# Patient Record
Sex: Female | Born: 1935 | Race: White | Hispanic: No | Marital: Married | Smoking: Never smoker
Health system: Southern US, Community
[De-identification: ages and names within clinical notes are randomized; demographics above are authoritative.]

## PROBLEM LIST (undated history)

## (undated) DIAGNOSIS — R5382 Chronic fatigue, unspecified: Secondary | ICD-10-CM

## (undated) DIAGNOSIS — I10 Essential (primary) hypertension: Secondary | ICD-10-CM

## (undated) DIAGNOSIS — R0902 Hypoxemia: Secondary | ICD-10-CM

## (undated) DIAGNOSIS — M949 Disorder of cartilage, unspecified: Secondary | ICD-10-CM

## (undated) DIAGNOSIS — J449 Chronic obstructive pulmonary disease, unspecified: Secondary | ICD-10-CM

## (undated) DIAGNOSIS — M899 Disorder of bone, unspecified: Secondary | ICD-10-CM

## (undated) DIAGNOSIS — T7840XA Allergy, unspecified, initial encounter: Secondary | ICD-10-CM

## (undated) DIAGNOSIS — J984 Other disorders of lung: Secondary | ICD-10-CM

## (undated) DIAGNOSIS — A799 Rickettsiosis, unspecified: Secondary | ICD-10-CM

## (undated) DIAGNOSIS — I272 Pulmonary hypertension, unspecified: Secondary | ICD-10-CM

## (undated) DIAGNOSIS — Z9981 Dependence on supplemental oxygen: Secondary | ICD-10-CM

## (undated) HISTORY — DX: Other disorders of lung: J98.4

## (undated) HISTORY — DX: Dependence on supplemental oxygen: Z99.81

## (undated) HISTORY — DX: Chronic obstructive pulmonary disease, unspecified: J44.9

## (undated) HISTORY — DX: Rickettsiosis, unspecified: A79.9

## (undated) HISTORY — DX: Essential (primary) hypertension: I10

## (undated) HISTORY — DX: Pulmonary hypertension, unspecified: I27.20

## (undated) HISTORY — PX: OOPHORECTOMY: SHX86

## (undated) HISTORY — DX: Chronic fatigue, unspecified: R53.82

## (undated) HISTORY — DX: Disorder of cartilage, unspecified: M94.9

## (undated) HISTORY — DX: Allergy, unspecified, initial encounter: T78.40XA

## (undated) HISTORY — DX: Hypoxemia: R09.02

## (undated) HISTORY — DX: Disorder of bone, unspecified: M89.9

---

## 2009-09-04 ENCOUNTER — Encounter: Payer: Self-pay | Admitting: Internal Medicine

## 2009-09-07 ENCOUNTER — Encounter: Payer: Self-pay | Admitting: Internal Medicine

## 2009-09-10 DIAGNOSIS — I272 Pulmonary hypertension, unspecified: Secondary | ICD-10-CM

## 2009-09-10 DIAGNOSIS — M899 Disorder of bone, unspecified: Secondary | ICD-10-CM

## 2009-09-10 DIAGNOSIS — M949 Disorder of cartilage, unspecified: Secondary | ICD-10-CM

## 2009-09-11 ENCOUNTER — Ambulatory Visit: Payer: Self-pay | Admitting: Internal Medicine

## 2009-09-11 DIAGNOSIS — R0602 Shortness of breath: Secondary | ICD-10-CM | POA: Insufficient documentation

## 2009-09-21 ENCOUNTER — Ambulatory Visit: Payer: Self-pay | Admitting: Internal Medicine

## 2009-10-02 ENCOUNTER — Telehealth (INDEPENDENT_AMBULATORY_CARE_PROVIDER_SITE_OTHER): Payer: Self-pay | Admitting: *Deleted

## 2009-11-02 ENCOUNTER — Ambulatory Visit: Payer: Self-pay | Admitting: Internal Medicine

## 2009-11-02 ENCOUNTER — Ambulatory Visit: Payer: Self-pay | Admitting: Cardiology

## 2009-11-02 LAB — CONVERTED CEMR LAB: CRP, High Sensitivity: 7.47 — ABNORMAL HIGH (ref 0.00–5.00)

## 2009-11-14 ENCOUNTER — Telehealth (INDEPENDENT_AMBULATORY_CARE_PROVIDER_SITE_OTHER): Payer: Self-pay | Admitting: *Deleted

## 2009-11-16 ENCOUNTER — Telehealth: Payer: Self-pay | Admitting: Internal Medicine

## 2009-11-29 ENCOUNTER — Encounter: Payer: Self-pay | Admitting: Internal Medicine

## 2009-12-07 ENCOUNTER — Encounter: Payer: Self-pay | Admitting: Internal Medicine

## 2010-01-03 ENCOUNTER — Encounter (HOSPITAL_COMMUNITY): Admission: RE | Admit: 2010-01-03 | Discharge: 2010-03-08 | Payer: Self-pay | Admitting: Internal Medicine

## 2010-01-07 ENCOUNTER — Encounter: Payer: Self-pay | Admitting: Internal Medicine

## 2010-02-10 ENCOUNTER — Encounter: Payer: Self-pay | Admitting: Internal Medicine

## 2010-02-14 ENCOUNTER — Telehealth (INDEPENDENT_AMBULATORY_CARE_PROVIDER_SITE_OTHER): Payer: Self-pay | Admitting: *Deleted

## 2010-03-04 ENCOUNTER — Ambulatory Visit: Payer: Self-pay | Admitting: Internal Medicine

## 2010-03-09 ENCOUNTER — Encounter (HOSPITAL_COMMUNITY)
Admission: RE | Admit: 2010-03-09 | Discharge: 2010-06-07 | Payer: Self-pay | Source: Home / Self Care | Attending: Internal Medicine | Admitting: Internal Medicine

## 2010-03-14 ENCOUNTER — Telehealth: Payer: Self-pay | Admitting: Internal Medicine

## 2010-03-18 ENCOUNTER — Encounter: Payer: Self-pay | Admitting: Internal Medicine

## 2010-04-16 ENCOUNTER — Encounter: Payer: Self-pay | Admitting: Internal Medicine

## 2010-05-13 ENCOUNTER — Telehealth: Payer: Self-pay | Admitting: Internal Medicine

## 2010-06-09 ENCOUNTER — Encounter (HOSPITAL_COMMUNITY)
Admission: RE | Admit: 2010-06-09 | Discharge: 2010-07-09 | Payer: Self-pay | Source: Home / Self Care | Attending: Internal Medicine | Admitting: Internal Medicine

## 2010-06-13 ENCOUNTER — Encounter: Payer: Self-pay | Admitting: Internal Medicine

## 2010-06-18 ENCOUNTER — Telehealth: Payer: Self-pay | Admitting: Internal Medicine

## 2010-06-28 ENCOUNTER — Telehealth (INDEPENDENT_AMBULATORY_CARE_PROVIDER_SITE_OTHER): Payer: Self-pay | Admitting: *Deleted

## 2010-06-28 DIAGNOSIS — M4010 Other secondary kyphosis, site unspecified: Secondary | ICD-10-CM | POA: Insufficient documentation

## 2010-07-09 NOTE — Letter (Signed)
Summary: [facility]  [facility]   Imported By: Lennie Odor 09/21/2009 17:04:50  _____________________________________________________________________  External Attachment:    Type:   Image     Comment:   External Document

## 2010-07-09 NOTE — Miscellaneous (Signed)
Summary: Orders Update pft charges  Clinical Lists Changes  Orders: Added new Service order of Carbon Monoxide diffusing w/capacity (94720) - Signed Added new Service order of Lung Volumes (94240) - Signed Added new Service order of Spirometry (Pre & Post) (94060) - Signed 

## 2010-07-09 NOTE — Progress Notes (Signed)
Summary: oxygen questions  Phone Note Call from Patient Call back at Home Phone [phone]   Caller: Patient Call For: MR Summary of Call: Has questions about her oxygen usage. Initial call taken by: Darletta Moll,  November 16, 2009 10:00 AM  Follow-up for Phone Call        pt just wanted to clarity when she was too use her oxygen. i advised that at last OV she walked 2 laps when she desaturated so that equals 370 feet, so if she is going to walk more then that she needs to wear oxygen, ie if she is going shopping, but not if she is going from her living room to her bathroom. Pt states understanding. Carron Curie CMA  November 16, 2009 10:14 AM

## 2010-07-09 NOTE — Progress Notes (Signed)
Summary: oxygen  Phone Note Call from Patient   Caller: Patient Call For: ramaswamy Summary of Call: pt received a call today about getting oxygen . she is not aware that Dr Marchelle Gearing was ordering it for her Initial call taken by: Rickard Patience,  November 14, 2009 9:43 AM  Follow-up for Phone Call        Spoke with pt about the Oxygen order per MR; she is aware of recs from MR about using O2 for activity usage.Also, made aware of test results and other recs from MR. 4 month reminder placed in IDX for pt to follow up with MR.[facility] CMA  November 14, 2009 9:50 AM

## 2010-07-09 NOTE — Letter (Signed)
Summary: [facility]  [facility]   Imported By: Lester Nina 12/12/2009 11:38:59  _____________________________________________________________________  External Attachment:    Type:   Image     Comment:   External Document

## 2010-07-09 NOTE — Letter (Signed)
Summary: [facility]  [facility]   Imported By: Lester Hanover 12/05/2009 09:28:01  _____________________________________________________________________  External Attachment:    Type:   Image     Comment:   External Document

## 2010-07-09 NOTE — Letter (Signed)
Summary: Physician's Statement & letter for oxygen use/[employer]  Physician's Statement & letter for oxygen use/[employer]   Imported By: Sherian Rein 03/21/2010 14:31:20  _____________________________________________________________________  External Attachment:    Type:   Image     Comment:   External Document

## 2010-07-09 NOTE — Progress Notes (Signed)
Summary: portable o2 -   Phone Note Call from Patient Call back at Home Phone [phone]   Caller: Patient Call For: Julia Lucero Reason for Call: Talk to Nurse Summary of Call: wants to know if she can get a smaller more portable unit for her oxygen?  Need to know about traveling with her oxygen also. PLanning a trip in the fall. Initial call taken by: Eugene Gavia,  February 14, 2010 11:23 AM  Follow-up for Phone Call        per chart, pt uses 2L o2 with activity.  called spoke with patient who is traveling by air to [place] later this fall x3weeks and states that her portable o2 tank is not very small and easily managed.  also, pt was told by friend that [facility] can supply her o2 while in [place].  would like smaller portable tank, and order to switch to [facility] if they can provide her with o2 while on vacation.  please advise if these orders may be placed, thanks!    *note: pt will check with her airline company to find out what documentation is needed for her to have o2 onboard.   Follow-up by: Boone Master CNA/MA,  February 14, 2010 12:51 PM  Additional Follow-up for Phone Call Additional follow up Details #1::         www.airlineoxygencouncil.org  She should check that website for resources. In general, each airline has different specs -so she should find the right fare and then call that airline and find out what combo of fare and o2 rules works best for her.   SHe could also buy  a pocket pulse ox on [employer] or [facility] etc. and make sure it is > 92% while flying.   She will need o2 in fllight Order sent for her requests   Additional Follow-up by: Kalman Shan MD,  February 14, 2010 5:04 PM    Additional Follow-up for Phone Call Additional follow up Details #2::    LMOM TCB. Boone Master CNA/MA  February 14, 2010 5:14 PM   Spoke with pt and notified of the above recs per MR.  Pt verbalized understanding and is aware that order was sent to [facility] for smaller o2  system. Follow-up by: Vernie Murders,  February 15, 2010 9:19 AM

## 2010-07-09 NOTE — Letter (Signed)
Summary: [facility]N for Oxygen / [facility]  [facility] for Oxygen / [facility]   Imported By: Lennie Odor 04/23/2010 11:22:59  _____________________________________________________________________  External Attachment:    Type:   Image     Comment:   External Document

## 2010-07-09 NOTE — Assessment & Plan Note (Signed)
Summary: PULM HYPERTENSION///kp   Visit Type:  Initial Consult Copy to:  Dr. Allyson Sabal Primary Provider/Referring Provider:  Dr. [facility]  CC:  Pulmonary Consult for increased SOB. Pt states she had a sinus infection an dthat is when she noticed SOB. This has been x 2 weeks. Marland Kitchen  History of Present Illness: 75  year female who had Rickets as a child. AT baseline has some fatigue and feelings of having to concentrate on walking since 2008/2009 but still managing 2h aerobics at home. Otherwise well. [place]  through 09/03/2009 for vacation.. Was well prior to trip and during first part of trip to [place].  During  2nd half of trip felt some sinus congestion. Took some OTC chlortrimeton antihistamine but it did not clear up symptoms. During this time started noticing insidious onset dyspnea on exertion esp at airport on way back. During change [place] in [place] airport, dyspnea was significant enough that she needed wheelchair. Subsequently went to toilet in plane and was 'feeling awful'. Husband through she looked awful, and per husband she passed out. Got basic first aid in plane (Orange Juice and  O2) and felt better. At landing she declined EMS. Saw Dr. Jacky Kindle next day on 09/04/2009. Noted to be hypoxemic 80% RA. -> CT PA gram showed cardiomegagly, bilateral effusion and RLL consolidation (per my review). She saw  Dr Allyson Sabal and was  started on  low dose diuresis samde day. ECHO 09/04/2009 showed LVEF >55% with impaired relaxation but also flattened septum reflectiv of RVSP of .  Followed with Dr. Allyson Sabal on 09/07/2009 and was feeling better. Asked to continue diuresis and started on amlodipine. Currently feeling better and walking at normal pace. Sinus also feels better. WAlkied 185 feet x 3 laps in office and did not desaturate.   Preventive Screening-Counseling & Management  Alcohol-Tobacco     Smoking Status: never  Current Medications (verified): 1)   Calcium Citrate 200 Mg Tabs (Calcium Citrate) .Marland Kitchen.. 1-2 Tablets Once Daily 2)  Multivitamins  Tabs (Multiple Vitamin) .Marland Kitchen.. 1 Once Daily 3)  Amlodipine Besylate 5 Mg Tabs (Amlodipine Besylate) .Marland Kitchen.. 1 Once Daily 4)  Furosemide 20 Mg Tabs (Furosemide) .Marland Kitchen.. 1 Once Daily 5)  Complete Allergy Relief 25 Mg Tabs (Diphenhydramine Hcl) .... As Needed Fo Rseasonal Allergies  Allergies (verified): 1)  ! Pcn  Past History:  Family History: Last updated: 09-20-09 Father-diabetes, died age 43 of MI 67, rheumatoid arthritis brother-colon cancer-died age 47  Social History: Last updated: 09/20/2009 Homemaker Patient never smoked.  No passive smoking 2 kids - [place], [place].  Husband is ex-[employer]  Risk Factors: Smoking Status: never (09-20-09)  Past Medical History: #OSTEOPENIA (ICD-733.90) #Seasonal Allergies #Chronic Fatigue with walking NOS x since 2008/2009  #RICKETTS as child with Malnutrition > she does not know details, states she had it as infant and had pneumonia as child before age 78 > Born in [place]  #Denies DM, CAD, STroke, TB, Hyperlipidemia, DVT, PE, Weight loss drug, RA, Lupus, Collagen Vasc Dz  Past Surgical History: 1965-ooph  Family History: Father-diabetes, died age 82 of MI 76, rheumatoid arthritis brother-colon cancer-died age 46  Social History: 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  No passive smoking 2 kids - [place], [place].  Husband is ex-[employer] aer[employer]Smoking Status:  never  Review of Systems       The patient complains of shortness of breath with activity, non-productive cough, and nasal congestion/difficulty breathing through nose.  The patient  denies shortness of breath at rest, productive cough, coughing up blood, chest pain, irregular heartbeats, acid heartburn, indigestion, loss of appetite, weight change, abdominal pain, difficulty swallowing, sore throat, tooth/dental  problems, headaches, sneezing, itching, ear ache, anxiety, depression, hand/feet swelling, joint stiffness or pain, rash, change in color of mucus, and fever.    Vital Signs:  Patient profile:   75 year old female Height:      59 inches Weight:      139.13 pounds BMI:     28.20 O2 Sat:      96 % on Room air Temp:     97.7 degrees F oral Pulse rate:   72 / minute BP sitting:   120 / 76  (right arm) Cuff size:   regular  Vitals Entered By: Carron Curie CMA (September 11, 2009 10:47 AM)  O2 Flow:  Room air  Serial Vital Signs/Assessments:  Comments: 1:22 PM Ambulatory Pulse Oximetry  Resting; HR__80___    02 Sat__94___  Lap1 (185 feet)   HR_91____   02 Sat__98___ Lap2 (185 feet)   HR__95___   02 Sat__91___    Lap3 (185 feet)   HR__95__   02 Sat___90__  _x__Test Completed without Difficulty ___Test Stopped due to:  By: Michel Bickers CMA   CC: Pulmonary Consult for increased SOB. Pt states she had a sinus infection an dthat is when she noticed SOB. This has been x 2 weeks.  Comments Medications reviewed with patient Carron Curie CMA  September 11, 2009 10:59 AM Daytime phone number verified with patient.    Physical Exam  General:  short overweight abnormal bony structure - c/w prior rickets Head:  normocephalic and atraumatic Eyes:  PERRLA/EOM intact; conjunctiva and sclera clear Ears:  TMs intact and clear with normal canals Nose:  no deformity, discharge, inflammation, or lesions Mouth:  no deformity or lesions Neck:  no masses, thyromegaly, or abnormal cervical nodes Chest Wall:  abnormal chest cage Lungs:  clear bilaterally to auscultation and percussion Heart:  regular rate and rhythm, S1, S2 without murmurs, rubs, gallops, or clicks  ? LOUD P2 Abdomen:  bowel sounds positive; abdomen soft and non-tender without masses, or organomegaly Msk:  no deformity or scoliosis noted with normal posture Pulses:  pulses normal Extremities:  no clubbing, cyanosis,  edema, or deformity noted Neurologic:  CN II-XII grossly intact with normal reflexes, coordination, muscle strength and tone Skin:  intact without lesions or rashes Cervical Nodes:  no significant adenopathy Axillary Nodes:  no significant adenopathy Psych:  alert and cooperative; normal mood and affect; normal attention span and concentration   EKG  Procedure date:  09/04/2009  Findings:      independently reviewd trace- looks normal   MISC. Report  Procedure date:  09/04/2009  Findings:      creat 0.8mg % hgb 11.9gm%/MCV 99.5 BNP 148  CT of Chest  Procedure date:  09/04/2009  Findings:      No PE Massive cardiomegaly, interstitial edema, bialteral effusions  Per radilogist c/w CHF  Comments:      no image, only report  CT of Chest  Procedure date:  09/04/2009  Findings:      independnetly reivewed: Bialteral effusion, cardiomegaly and RLL consolidations  Impression & Recommendations:  Problem # 1:  SHORTNESS OF BREATH (SOB) (ICD-786.05) Assessment New  I suspect she has chronic chest cage restriction for many years from RICKETS. This might have caused chronic pulmonary hypertension (Cor pulmonale) thtat might explain some symptoms like chronic inability  to take a deep breath and feeling of chest tighness. I think most recently in [place] she decompensated with acute cor pulmonale (CT findings and ECHO Findings of 3/29) due to RT sided pneumonia. This is a likely scenario. I  hae discussed with Dr. Allyson Sabal who is going to get a nuclear medicine stress test. I  hsupport diuresis and low level calcium channel blockade which have helped. I think we will get full PFT to establish restriction. Then, we will re-scan her in 4-6 weeks or get CXR. IF pna and effusions are resolved, we do Right Heart Cath.   Pulmonary Veno Occlusive Disease is a rare possibility but I doubt she has it  I have explained the above to her over phone on 09/17/2009   Orders: Consultation  Level V [phone])  Medications Added to Medication List This Visit: 1)  Complete Allergy Relief 25 Mg Tabs (Diphenhydramine hcl) .... As needed fo rseasonal allergies  Other Orders: Pulmonary Referral (Pulmonary)  Patient Instructions: 1)  please have full PFT  2)  please bring CD Rom of your CT scan from 3/29 3)  I will talk to Dr. Allyson Sabal 4)  Once you are done with PFT call us for review over phone 5)  Will keep you posted this week with plan (over phone) 6)  ........ 7)  UPdate 09/17/2009 8)  revoiew PFT and decide next step   Immunization History:  Pneumovax Immunization History:    Pneumovax:  pneumovax (06/10/2007)

## 2010-07-09 NOTE — Letter (Signed)
Summary: [facility]   Imported By: Lennie Odor 09/21/2009 17:03:57  _____________________________________________________________________  External Attachment:    Type:   Image     Comment:   External Document

## 2010-07-09 NOTE — Assessment & Plan Note (Signed)
Summary: 4wk rov with chest ct@[facility] @1 :00pm   Visit Type:  Follow-up Copy to:  Dr. Allyson Sabal Primary Provider/Referring Provider:  Dr. Jacky Kindle, [facility]  CC:  Pt here to discuss ct results an dpft results. .  History of Present Illness: Followup Pulmonary hypertension possibly related to thoracic cage defect from childhood Rickets. Decompensated Cor Pulmonale following trip to [facility] in March 2011 due to pneumonia (CT chest  march 2011 at [facility] showedRLL consolidation and bialteral effusion)  OV 11/02/2009: Last seen04/24/11. She presents with her husband. Since last visit she continues to be on lasix and amlodipine. This was started by Dr. Allyson Sabal in March 2011 following her acute decompensation. OVerall she states she feels at pre-illness baseline. Has mild dyspnea and chest tightness when she climbs stairs. Looking for ways to get reconditioned and feel better.Compliant with medicines. No interim problems. Denies edema, cough, orthopnea, paroxysmal nocturnal dyspnea, wheeze. However, she did desaturate with exertion in the office today.   Preventive Screening-Counseling & Management  Alcohol-Tobacco     Smoking Status: never  Current Medications (verified): 1)  Calcium Citrate 200 Mg Tabs (Calcium Citrate) .Marland Kitchen.. 1-2 Tablets Once Daily 2)  Multivitamins  Tabs (Multiple Vitamin) .Marland Kitchen.. 1 Once Daily 3)  Amlodipine Besylate 5 Mg Tabs (Amlodipine Besylate) .Marland Kitchen.. 1 Once Daily 4)  Furosemide 20 Mg Tabs (Furosemide) .Marland Kitchen.. 1 Once Daily 5)  Complete Allergy Relief 25 Mg Tabs (Diphenhydramine Hcl) .... As Needed Fo Rseasonal Allergies 6)  Cream .... Apply To Skin As Needed  Allergies (verified): 1)  ! Pcn  Past History:  Past Surgical History: Last updated: 09/30/2009 1965-ooph  Family History: Last updated: 09-30-2009 Father-diabetes, died age 77 of MI 50, rheumatoid arthritis brother-colon cancer-died age 42  Social History: Last updated:  09/30/09 Homemaker Patient never smoked.  No passive smoking 2 kids - [place], [place].  Husband is [employer]  Risk Factors: Smoking Status: never (11/02/2009)  Past Medical History: Reviewed history from 2009/09/30 and no changes required. #OSTEOPENIA (ICD-733.90) #Seasonal Allergies #Chronic Fatigue with walking NOS x since 2008/2009  #RICKETTS as child with Malnutrition > she does not know details, states she had it as infant and had pneumonia as child before age 44 > Born in [place]  #Denies DM, CAD, STroke, TB, Hyperlipidemia, DVT, PE, Weight loss drug, RA, Lupus, Collagen Vasc Dz  Past Pulmonary History:  Pulmonary History: Rickets as a child. AT baseline has some fatigue and feelings of having to concentrate on walking since 2008/2009 but still managing 2h aerobics at home. Otherwise well. [place]  through 09/03/2009 for vacation.. Was well prior to trip and during first part of trip to [place].  During  2nd half of trip felt some sinus congestion. Took some OTC chlortrimeton antihistamine but it did not clear up symptoms. During this time started noticing insidious onset dyspnea on exertion esp at airport on way back. [place], dyspnea was significant enough that she needed wheelchair. Subsequently went to toilet in plane and was 'feeling awful'. Husband through she looked awful, and per husband she passed out. Got basic first aid in plane (Orange Juice and  O2) and felt better. At landing she declined EMS. Saw Dr. Jacky Kindle next day on 09/04/2009. Noted to be hypoxemic 80% RA. -> CT PA gram showed cardiomegagly, bilateral effusion and RLL consolidation (per my review). She saw  Dr Allyson Sabal and was  started on  low dose diuresis samde day. ECHO  09/04/2009 showed LVEF >55% with impaired relaxation but also flattened septum reflectiv of RVSP of .  Followed with Dr. Allyson Sabal on 09/07/2009 and was feeling  better. Asked to continue diuresis and started on amlodipine. Currently feeling better and walking at normal pace. Sinus also feels better.   > PFTs 09/21/2009 - REstriction p FVC 1.25L/56%, TLC 2.87L/76%, DLCO 7.7/47%,  > WAlkied 185 feet x 3 laps in officeon 09/11/2009  and dropped to 90% > Waked 185 feet x 2 laps office 11/02/2009 -> Dropped to 85% > CT 11/02/2009 - effusion and consolidation resolved  Family History: Reviewed history from 09/11/2009 and no changes required. Father-diabetes, died age 19 of MI 36, rheumatoid arthritis brother-colon cancer-died age 33  Social History: Reviewed history from 09/11/2009 and no changes required. 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  No passive smoking 2 kids - [place], [place].  Husband is [employer]  Review of Systems  The patient denies shortness of breath with activity, shortness of breath at rest, productive cough, non-productive cough, coughing up blood, chest pain, irregular heartbeats, acid heartburn, indigestion, loss of appetite, weight change, abdominal pain, difficulty swallowing, sore throat, tooth/dental problems, headaches, nasal congestion/difficulty breathing through nose, sneezing, itching, ear ache, anxiety, depression, hand/feet swelling, joint stiffness or pain, rash, change in color of mucus, and fever.    Vital Signs:  Patient profile:   75 year old female Height:      59 inches Weight:      136.31 pounds BMI:     27.63 O2 Sat:      92 % on Room air Temp:     98.1 degrees F oral Pulse rate:   92 / minute BP sitting:   122 / 64  (left arm) Cuff size:   regular  Vitals Entered By: Carron Curie CMA (Nov 02, 2009 2:44 PM)  O2 Flow:  Room air CC: Pt here to discuss ct results an dpft results.  Comments Medications reviewed with patient Carron Curie CMA  Nov 02, 2009 2:44 PM Daytime phone number verified with patient. Ambulatory Pulse Oximetry  Resting; HR_88____    02  Sat_93%RA____  Lap1 (185 feet)   HR_98____   02 Sat__93%RA___ Lap2 (185 feet)   HR__100___   02 Sat__85%RA___    Lap3 (185 feet)   HR_____   02 Sat_____  ___Test Completed without Difficulty __X_Test Stopped due to: Patient desauration to 84%RA I was unable to return her to room he dropped so low. half way threough lap #2 she dropped to 88% and as I proceeded to return her to the room she continued to drop. I received a recovery saturation level of 94%RA with a heart rate of 84 Julia Lucero, CMA  Nov 02, 2009 4:06 PM        Physical Exam  General:  short overweight abnormal bony structure - c/w prior rickets Head:  normocephalic and atraumatic Eyes:  PERRLA/EOM intact; conjunctiva and sclera clear Ears:  TMs intact and clear with normal canals Nose:  no deformity, discharge, inflammation, or lesions Mouth:  no deformity or lesions Neck:  no masses, thyromegaly, or abnormal cervical nodes Chest Wall:  abnormal chest cage Lungs:  clear bilaterally to auscultation and percussion Heart:  regular rate and rhythm, S1, S2 without murmurs, rubs, gallops, or clicks  ? LOUD P2 Abdomen:  bowel sounds positive; abdomen soft and non-tender without masses, or organomegaly Msk:  no deformity or scoliosis noted with normal posture Pulses:  pulses normal Extremities:  no clubbing,  cyanosis, edema, or deformity noted Neurologic:  CN II-XII grossly intact with normal reflexes, coordination, muscle strength and tone Skin:  intact without lesions or rashes Cervical Nodes:  no significant adenopathy Axillary Nodes:  no significant adenopathy Psych:  alert and cooperative; normal mood and affect; normal attention span and concentration   CT of Chest  Procedure date:  11/02/2009  Findings:      shows cleraance of effusion and RLL consolidation  MISC. Report  Procedure date:  09/21/2009  Findings:      PFTs 09/21/2009 - REstriction p FVC 1.25L/56%, TLC 2.87L/76%, DLCO 7.7/47%,    Impression & Recommendations:  Problem # 1:  PULMONARY HYPERTENSION (ICD-416.8) Assessment Improved Clinically she is better although she did desaturate with exertion. She continues on lasix on amlodipine. I still think etiology is restrictive thoracic cage due to rickets. Nevertheless, I will orderautomimune profile. FOr now, I have advicsed her that there is no need for RIght Heart Cath. She tells me that stress myoview is normal (formal report I do not have). I have referred her to rehab.Will start home o2 with exertion. I explained that if atuoimune workupnegative thenpulm htn likely definitely due to thoracic cage restriciton and she wont be candidate for advanced PAH Rx. For now, I will plan on seeing her in 4 months. I can discuss right heart cath at followup  Orders: T- * Misc. Laboratory test [phone]) Rehabilitation Referral (Rehab) Est. Patient Level III (60454) Est. Patient Level III (09811) DME Referral (DME)  Medications Added to Medication List This Visit: 1)  Cream  .... Apply to skin as needed  Other Orders: T-Antinuclear Antib (ANA) [phone]) TLB-Rheumatoid Factor (RA) (13086-VH) TLB-Sedimentation Rate (ESR) (85652-ESR) TLB-CRP-High Sensitivity (C-Reactive Protein) (86140-FCRP)  Patient Instructions: 1)  I will do blood test for autoiimmune disease 2)  WE will walk you now for oxygen levels 3)  I will refer you to pulmonary rehab 4)  I am glad you are better 5)  I will call you with blood test result 6)  REturn to see me in 1 year or sooner if there are problems 7)  .... 8)  update 11/13/2009 - rov 4 months 9)  start home o2 with exertion and at night  Appended Document: 4wk rov with chest ct@[facility] @1 :00pm Jen,  Please call her a) please tell her that blood test workup looknig for other causes (Esp "autoimmune" where body attacks itself) is all negative.  b) this means pulm htn is mostly from her rickets related chest cage c) because she dropped oxygen  walking -  I have thought about it and feel she needsto be on oxygen for activities like going out, groceries etc.,  d) definitely start pulmonary rehab e) instead of one year, I will see her in 4 months instead of 1 year what I told her f) apologize for delay-  I was travelling g) if she still wants to talk to me - I will  Appended Document: 4wk rov with chest ct@[facility] @1 :00pm Pt is aware of recs and O2 needed. 25month reminder in IDX for follow up visit with MR.

## 2010-07-09 NOTE — Progress Notes (Signed)
Summary: papers re: air travel w/ O2  Phone Note Call from Patient Call back at Home Phone [phone]   Caller: Patient Call For: ramaswamy Summary of Call: pt dropped off papers to be signed by MR re: air travel w/ portable O2. call pt when ready for pickup. (papers given to [facility]) Initial call taken by: Tivis Ringer, CNA,  March 14, 2010 12:28 PM  Follow-up for Phone Call        forms have been placed in MR look-at. It is in a large brown envelop. Carron Curie CMA  March 15, 2010 4:18 PM   Additional Follow-up for Phone Call Additional follow up Details #1::        i filled it. The 2nd sheet state it needs to be on my letter head. So, please get that copied onto our letterhead or Trudi Ida might have  a copy pre-exisiting on our letter head. once it is done , get hold of me to sign it Additional Follow-up by: Kalman Shan MD,  March 18, 2010 2:02 PM    Additional Follow-up for Phone Call Additional follow up Details #2::    Paperwork completed and placed at front. Pt aware. Carron Curie CMA  March 19, 2010 8:37 AM

## 2010-07-09 NOTE — Assessment & Plan Note (Signed)
Summary: 4 month/apc   Visit Type:  Follow-up Copy to:  Dr. Allyson Sabal Primary Provider/Referring Provider:  Dr. Jacky Kindle, [facility]  CC:  Pt here for 4 month follow-up. 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 Decompensated Cor Pulmonale and discovery of pulmonary hypertension on ECHO (no right heart cath) following trip to [facility] in March 2011 due to pneumonia (CT chest  march 2011 at [facility] showedRLL consolidation and bialteral effusion). Negative Autoimmune Profile May 2011 except for mild raised CRP and borderline positive rheumatoid factor     March 04, 2010: feels well. Doing rehab. Not desaturating there. Due to fly to [facility] 10/20/2011on [employer]. Lot of questions on  flight o2. WE walked her today 185 feet x 3 laps and lowest o2 was only 92%. This is a huge improvement because in MAy 2011 she did desaturate with exertion. No new complaints. She continues on lasix and amlodipine started by Dr. Allyson Sabal  in Apri 2011. Compliant with medicines. No interim problems. Denies edema, cough, orthopnea, paroxysmal nocturnal dyspnea, wheeze. She has seen Dr. Allyson Sabal in interim and per his records he has resasured her and is following expectantly   Preventive Screening-Counseling & Management  Alcohol-Tobacco     Smoking Status: never  Current Medications (verified): 1)  Calcium Citrate 200 Mg Tabs (Calcium Citrate) .Marland Kitchen.. 1-2 Tablets Once Daily 2)  Multivitamins  Tabs (Multiple Vitamin) .Marland Kitchen.. 1 Once Daily 3)  Amlodipine Besylate 5 Mg Tabs (Amlodipine Besylate) .Marland Kitchen.. 1 Once Daily 4)  Furosemide 20 Mg Tabs (Furosemide) .Marland Kitchen.. 1 Once Daily 5)  Complete Allergy Relief 25 Mg Tabs (Diphenhydramine Hcl) .... As Needed Fo Rseasonal Allergies 6)  Clobetasol Propionate 0.05 % Crea (Clobetasol Propionate) .... As Needed 7)  Aspirin 81 Mg Tbec (Aspirin) .... As Needed 8)  Oxygen .... 2 Liters  With Exertion  Allergies (verified): 1)  ! Pcn 2)  ! Sulfa  Past History:  Past medical, surgical, family and social histories (including risk factors) reviewed, and no changes noted (except as noted below).  Past Medical History: #OSTEOPENIA (ICD-733.90) #Seasonal Allergies #Chronic Fatigue with walking NOS x since 2008/2009  #RICKETTS as child with Malnutrition  - she does not know details, states she had it as infant and had pneumonia as child before age 37  - Born in [place]  #Denies DM, CAD, STroke, TB, Hyperlipidemia, DVT, PE, Weight loss drug, RA, Lupus, Collagen Vasc Dz  Past Surgical History: Reviewed history from 09/11/2009 and no changes required. 1965-ooph  Past Pulmonary History:  Pulmonary History: Rickets as a child. AT baseline has some fatigue and feelings of having to concentrate on walking since 2008/2009 but still managing 2h aerobics at home. Otherwise well. [place]  through 09/03/2009 for vacation.. Was well prior to trip and during first part of trip to [place].  During  2nd half of trip felt some sinus congestion. Took some OTC chlortrimeton antihistamine but it did not clear up symptoms. During this time started noticing insidious onset dyspnea on exertion esp at airport on way back. During [place] in [place], dyspnea was significant enough that she needed wheelchair. Subsequently went to toilet in plane and was 'feeling awful'. Husband through she looked awful, and per husband she passed out. Got basic first aid in plane (Orange Juice and  O2) and felt better. At landing she declined EMS. Saw Dr. Jacky Kindle next day on 09/04/2009. Noted to be  hypoxemic 80% RA. -> CT PA gram showed cardiomegagly, bilateral effusion and RLL consolidation (per my review). She saw  Dr Allyson Sabal and was  started on  low dose diuresis samde day. ECHO 09/04/2009 showed LVEF >55% with impaired relaxation but also flattened septum reflectiv of RVSP of  .  Followed with Dr. Allyson Sabal on 09/07/2009 and was feeling better. Asked to continue diuresis and started on amlodipine. Currently feeling better and walking at normal pace. Sinus also feels better.    - PFTs 09/21/2009 - REstriction p FVC 1.25L/56%, TLC 2.87L/76%, DLCO 7.7/47%,   - WAlkied 185 feet x 3 laps in officeon 09/11/2009  and dropped to 90%  - Waked 185 feet x 2 laps office 11/02/2009 -> Dropped to 85%  - CT 11/02/2009 - effusion and consolidation resolved  - Walked 185 feet x 3 laps: lowest pulse ox only 92%  Family History: Reviewed history from 09/11/2009 and no changes required. Father-diabetes, died age 62 of MI 39, rheumatoid arthritis brother-colon cancer-died age 25  Social History: Reviewed history from 09/11/2009 and no changes required. 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  No passive smoking 2 kids - [place], [place].  Husband is [employer]  Review of Systems       The patient complains of shortness of breath with activity.  The patient denies shortness of breath at rest, productive cough, non-productive cough, coughing up blood, chest pain, irregular heartbeats, acid heartburn, indigestion, loss of appetite, weight change, abdominal pain, difficulty swallowing, sore throat, tooth/dental problems, headaches, nasal congestion/difficulty breathing through nose, sneezing, itching, ear ache, anxiety, depression, hand/feet swelling, joint stiffness or pain, rash, change in color of mucus, and fever.    Vital Signs:  Patient profile:   75 year old female Height:      59 inches Weight:      136 pounds BMI:     27.57 O2 Sat:      96 % on Room air Temp:     98.1 degrees F oral Pulse rate:   68 / minute BP sitting:   118 / 70  (right arm) Cuff size:   regular  Vitals Entered By: Carron Curie CMA (March 04, 2010 9:03 AM)  O2 Flow:  Room air  Serial Vital Signs/Assessments:  Comments: 10:01 AM Ambulatory Pulse  Oximetry  Resting; HR__72___    02 Sat___94% on room air__  Lap1 (185 feet)   HR__94___   02 Sat__97% on room air___ Lap2 (185 feet)   HR__96___   02 Sat__94% on room air___    Lap3 (185 feet)   HR__93___   02 Sat__92% on room air___  _x__Test Completed without Difficulty ___Test Stopped due to:  By: Michel Bickers CMA   CC: Pt here for 4 month follow-up.  Comments Medications reviewed with patient Carron Curie CMA  March 04, 2010 9:09 AM Daytime phone number verified with patient.    Physical Exam  General:  short overweight abnormal bony structure - c/w prior rickets Head:  normocephalic and atraumatic Eyes:  PERRLA/EOM intact; conjunctiva and sclera clear Ears:  TMs intact and clear with normal canals Nose:  no deformity, discharge, inflammation, or lesions Mouth:  no deformity or lesions Neck:  no masses, thyromegaly, or abnormal cervical nodes Chest Wall:  abnormal chest cage Lungs:  clear bilaterally to auscultation and percussion Heart:  regular rate and rhythm, S1, S2 without murmurs, rubs, gallops, or clicks  ? LOUD P2 Abdomen:  bowel sounds positive; abdomen soft and non-tender without masses, or organomegaly  Msk:  no deformity or scoliosis noted with normal posture Pulses:  pulses normal Extremities:  no clubbing, cyanosis, edema, or deformity noted Neurologic:  CN II-XII grossly intact with normal reflexes, coordination, muscle strength and tone Skin:  intact without lesions or rashes Cervical Nodes:  no significant adenopathy Axillary Nodes:  no significant adenopathy Psych:  alert and cooperative; normal mood and affect; normal attention span and concentration   Impression & Recommendations:  Problem # 1:  PULMONARY HYPERTENSION (ICD-416.8) Assessment Improved  Clinically she is better although. She is no longer desaturating with exertion. She continues on lasix on amlodipine. I still think etiology is restrictive thoracic cage due to rickets.  Automimune profile negative last viist in May 2011. Due to improvement, I have advicsed her that there is no need for RIght Heart Cath. She tells me that stress myoview is normal in spring 2011 per hx. She is doing weill in rehab since june/july 2011. I think the amlodipine, lasix, and rheab are working well for her. Best to follow along. No indication for advanced PAH RX at this point.   She will still benefit from O2 for flight to [place]. I looked up [employer] information and gave her instructions via email. Advised to take pulse ox with her on flight.  Problem # 2:  SHORTNESS OF BREATH (SOB) (ICD-786.05)  Medications Added to Medication List This Visit: 1)  Clobetasol Propionate 0.05 % Crea (Clobetasol propionate) .... As needed 2)  Aspirin 81 Mg Tbec (Aspirin) .... As needed 3)  Oxygen  .... 2 liters with exertion  Other Orders: Flu Vaccine 45yrs + MEDICARE PATIENTS (G9562) Administration Flu vaccine - MCR (G0008) Est. Patient Level III (13086)  Patient Instructions: 1)  your oxygen levels walking are better today 2)  you can still take o2 with you by plane and [place] 3)  check out website www.airlineoxygencouncil.org 4)  have flu shot today 5)  my nurse will check if your husband a [place] patient can also have flu shot today 6)  continue pulmonary rehab 7)  I will check [employer] and we will get hold of [employer] to see how to make your o2 last the 5h flight 8)  i will see you in 6-9 months or sooner if there are problems   Flu Vaccine Consent Questions     Do you have a history of severe allergic reactions to this vaccine? no    Any prior history of allergic reactions to egg and/or gelatin? no    Do you have a sensitivity to the preservative Thimersol? no    Do you have a past history of Guillan-Barre Syndrome? no    Do you currently have an acute febrile illness? no    Have you ever had a severe reaction to latex? no    Vaccine information given and explained to  patient? yes    Are you currently pregnant? no    Lot Number:[id number]   Exp Date:12/07/2010   Site Given  Left Deltoid IMdflu Carron Curie CMA  March 04, 2010 10:11 AM

## 2010-07-09 NOTE — Progress Notes (Signed)
Summary: cough  Phone Note Call from Patient Call back at Home Phone [phone]   Caller: Patient Call For: Ramaswamy Reason for Call: Talk to Nurse Summary of Call: Pt c/o cough is no better x 1 1/2 weeks, worse with exertion. Pt states she wants MR recs instead of coming in if possible. Initial call taken by: Zackery Barefoot CMA,  May 13, 2010 9:41 AM  Follow-up for Phone Call        Spoke with pt.  She is c/o dry cough x 1 wk.  She states that "it feels like I need to cough something up", but unable to produce and sputum.  She states also having some nasal congestion.  She denies any changes in her breathing or other symptoms.  I offered ov but she declined, stating she would rather just get recs from MR over the phone. Pls advise thanks! Follow-up by: Vernie Murders,  May 13, 2010 10:26 AM  Additional Follow-up for Phone Call Additional follow up Details #1::        10 days ago had fever and sore throat. Since then dry cough but getting better overall but does not feel it is getting better early in morning. Currently no fever or sputum. OVerall cough present with change in position. Overall it is moderate to mild. Feels socially embarassed. Advised to try OTC Robitussin and netti pot for saline congestion. If worse, she will call Additional Follow-up by: Kalman Shan MD,  May 13, 2010 5:30 PM

## 2010-07-09 NOTE — Progress Notes (Signed)
Summary: results-lmtcb  Phone Note Call from Patient   Caller: Patient Call For: ramaswamy Summary of Call: calling for results of pft Initial call taken by: Rickard Patience,  October 02, 2009 1:41 PM  Follow-up for Phone Call        pt calling for PFT results done here 09-21-2009.  Please advise.  Thanks.  Aundra Millet Reynolds LPN  October 02, 2009 2:22 PM   Additional Follow-up for Phone Call Additional follow up Details #1::        PFTs show 'restriction" this is like somebody has tied a belt around heer chest preventing her to take a deep breath. This is what I told her I would suspect. I think this is due to Rickets. I will see her in 4 weeks from now wiht CT chest non contrast to ensure that all the pneumoani in chest clears up. And, then we will discuss  Additional Follow-up by: Kalman Shan MD,  October 04, 2009 3:54 PM    Additional Follow-up for Phone Call Additional follow up Details #2::    LMTCB. Carron Curie CMA  October 04, 2009 4:01 PM  Returning phone call Darletta Moll  October 04, 2009 4:50 PM  Spoke with pt and notified of results/recs per MR.  Pt verbalized understanding and order has been sent to [facility] for ct scan to be set up with appt with MR to follow. Follow-up by: Vernie Murders,  October 05, 2009 9:19 AM

## 2010-07-09 NOTE — Letter (Signed)
Summary: Dr Allyson Sabal [facility]  [facility]   Imported By: Lester Verdon 01/24/2010 10:41:48  _____________________________________________________________________  External Attachment:    Type:   Image     Comment:   External Document

## 2010-07-09 NOTE — Letter (Signed)
Summary: CMN for Oxygen/[employer]  CMN for Oxygen/[employer]   Imported By: Sherian Rein 04/05/2010 07:13:09  _____________________________________________________________________  External Attachment:    Type:   Image     Comment:   External Document

## 2010-07-11 NOTE — Progress Notes (Signed)
Summary: PT referral  Phone Note Call from Patient Call back at Home Phone [phone]   Caller: Patient Call For: ramaswamy Summary of Call: pt wants a rx for physical therapy to strengthen her core muscles.  Initial call taken by: Tivis Ringer, CNA,  June 28, 2010 9:19 AM  Follow-up for Phone Call        PT seen PT at [facility] yesterday wanting to strengthen core muscles to help with breathing.  PT wants to know if Dr Marchelle Gearing will give her a referral for this so that [employer] will pay for this. Please advise. Abigail Miyamoto RN  June 28, 2010 10:38 AM   Additional Follow-up for Phone Call Additional follow up Details #1::        sure . done Additional Follow-up by: Kalman Shan MD,  June 28, 2010 2:10 PM  New Problems: KYPHOSIS ASSOCIATED WITH OTHER CONDITION (ICD-737.41)   Additional Follow-up for Phone Call Additional follow up Details #2::    [email or web address].Michel Bickers CMA  June 28, 2010 2:30 PM  Pt is aware referral has been made and we will call once appt is made.Michel Bickers CMA  June 28, 2010 3:12 PM  New Problems: KYPHOSIS ASSOCIATED WITH OTHER CONDITION (ICD-737.41)

## 2010-07-11 NOTE — Progress Notes (Signed)
Summary: complete pulmonary rehab  Phone Note Call from Julia Lucero   Caller: Julia Lucero Call For: dr. Marchelle Gearing Summary of Call: Julia Lucero phoned to advise that she has completed pulmonary rehab at [facility] on 06/13/10 and she going to return to her regular exercise and she is going to see someone for strenghtening her core muscles. Julia Lucero can be reached at [phone]  Initial call taken by: Vedia Coffer,  June 18, 2010 3:00 PM  Follow-up for Phone Call        Spoke with pt.  She states just wanted to let MR know that she has completed pulmonary rehab as of last wk. She feels like this has helped withher breathing and she will be exercising regularly from now on.  Just an FYI.  Follow-up by: Vernie Murders,  June 18, 2010 3:13 PM  Additional Follow-up for Phone Call Additional follow up Details #1::        great. thanks Additional Follow-up by: Kalman Shan MD,  June 18, 2010 5:30 PM

## 2010-08-13 ENCOUNTER — Encounter: Payer: Self-pay | Admitting: Internal Medicine

## 2010-08-14 ENCOUNTER — Telehealth: Payer: Self-pay | Admitting: Internal Medicine

## 2010-08-15 NOTE — Miscellaneous (Signed)
Summary: Progress Report/[facility]  Progress Report/[facility]   Imported By: Sherian Rein 08/07/2010 14:45:48  _____________________________________________________________________  External Attachment:    Type:   Image     Comment:   External Document

## 2010-08-20 NOTE — Progress Notes (Signed)
Summary: airline form  Phone Note Outgoing Call   Call placed by: Carron Curie CMA,  August 14, 2010 11:03 AM Summary of Call: Pt dropped off forms for MR to [facility] [employer] for her to take oxygen with her on trip. Form completed and placed at front. A copy was placed inscan folder. Pt is aware.  Initial call taken by: Carron Curie CMA,  August 14, 2010 11:04 AM

## 2010-08-27 NOTE — Letter (Signed)
Summary: Physician Statement/[employer]  Physician Statement/[employer]   Imported By: Lester Yale 08/19/2010 07:18:31  _____________________________________________________________________  External Attachment:    Type:   Image     Comment:   External Document

## 2010-10-28 ENCOUNTER — Encounter: Payer: Self-pay | Admitting: Internal Medicine

## 2010-11-01 ENCOUNTER — Ambulatory Visit (INDEPENDENT_AMBULATORY_CARE_PROVIDER_SITE_OTHER): Payer: Medicare Other | Admitting: Internal Medicine

## 2010-11-01 ENCOUNTER — Encounter: Payer: Self-pay | Admitting: Internal Medicine

## 2010-11-01 VITALS — BP 138/60 | HR 79 | Temp 98.3°F | Ht <= 58 in | Wt 138.6 lb

## 2010-11-01 DIAGNOSIS — I2789 Other specified pulmonary heart diseases: Secondary | ICD-10-CM

## 2010-11-01 NOTE — Patient Instructions (Signed)
Please continue daily exercise - make sure heart rate is < 120-125 - you can take your heart rate that high but not higher Make sure pulse ox is always >88% with or without oxygen Use oxygen with exercise and walking and moving around We will set up overnight oxygen study on room air to determine your oxygen need at night Please see Dr. Allyson Sabal as well REturn to see me in 6 months Come sooner if there are problems

## 2010-11-01 NOTE — Progress Notes (Signed)
Subjective:    Patient ID: Julia Lucero, female    DOB: Apr 29, 1936, 75 y.o.   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 Decompensated Cor Pulmonale and discovery of pulmonary hypertension on ECHO (no right heart cath) following trip to [facility] in March 2011 due to pneumonia (CT chest  march 2011 at [facility] showedRLL consolidation and bialteral effusion). Negative Autoimmune Profile May 2011 except for mild raised CRP and borderline positive rheumatoid factor   March 04, 2010: feels well. Doing rehab. Not desaturating there. Due to fly to [facility] 10/20/2011on [employer]. Lot of questions on  flight o2. WE walked her today 185 feet x 3 laps and lowest o2 was only 92%. This is a huge improvement because in MAy 2011 she did desaturate with exertion. No new complaints. She continues on lasix and amlodipine started by Dr. Allyson Sabal  in Apri 2011. Compliant with medicines. No interim problems. Denies edema, cough, orthopnea, paroxysmal nocturnal dyspnea, wheeze. She has seen Dr. Allyson Sabal in interim and per his records he has resasured her and is following expectantly. REC: EXPECTANT FOLLOWUP  OV 11/01/2010: OVerall well. Has completed rehab. Underwent PT for muscle strengthening but now doing it on own. Unclear if has increasing dyspnea or not. AT times she denies it but at times she states she is marginally worse. Certainly seems to get tired by end of the day which might be baseline. Uses o2 wwiht exertion.  Continues lasix and amlodipine. Not seen Dr. Allyson Sabal in interim either. No worsening edema. No wheeze. No chest pain. No syncope. No weight loss. No pnd. No orthopnea. Had 3 flight trips in interim and did well. Has repeat question on mechanism of restrictive chest disease and pulmonary hypertension. Walking desaturatin test today - 3 laps and dropped to 88% - 87% after 2 laps   Review of Systems  Constitutional:  Negative for fever and unexpected weight change.  HENT: Negative for ear pain, nosebleeds, congestion, sore throat, rhinorrhea, sneezing, trouble swallowing, dental problem, postnasal drip and sinus pressure.   Eyes: Negative for redness and itching.  Respiratory: Positive for shortness of breath. Negative for cough, chest tightness and wheezing.   Cardiovascular: Negative for palpitations and leg swelling.  Gastrointestinal: Negative for nausea and vomiting.  Genitourinary: Negative for dysuria.  Musculoskeletal: Negative for joint swelling.  Skin: Negative for rash.  Neurological: Negative for headaches.  Hematological: Does not bruise/bleed easily.  Psychiatric/Behavioral: Negative for dysphoric mood. The patient is not nervous/anxious.        Objective:   Physical Exam    General:  short overweight abnormal bony structure - c/w prior rickets Head:  normocephalic and atraumatic Eyes:  PERRLA/EOM intact; conjunctiva and sclera clear Ears:  TMs intact and clear with normal canals Nose:  no deformity, discharge, inflammation, or lesions Mouth:  no deformity or lesions Neck:  no masses, thyromegaly, or abnormal cervical nodes Chest Wall:  abnormal chest cage Lungs:  clear bilaterally to auscultation and percussion Heart:  regular rate and rhythm, S1, S2 without murmurs, rubs, gallops, or clicks  ? LOUD P2 Abdomen:  bowel sounds positive; abdomen soft and non-tender without masses, or organomegaly Msk:  no deformity or scoliosis noted with normal posture. EDEMA + Pulses:  pulses normal Extremities:  no clubbing, cyanosis, edema, or deformity noted Neurologic:  CN II-XII grossly intact with normal reflexes, coordination, muscle strength and tone Skin:  intact without lesions or rashes Cervical Nodes:  no significant adenopathy  Axillary Nodes:  no significant adenopathy Psych:  alert and cooperative; normal mood and affect; normal attention span and concentration    Assessment  & Plan:

## 2010-11-01 NOTE — Progress Notes (Signed)
SATURATION QUALIFICATIONS:  Patient Saturations on Room Air at Rest = 95%  Patient Saturations on Room Air while Ambulating = 87%  Patient Saturations on 2 Liters of oxygen while Ambulating = 96%

## 2010-11-01 NOTE — Assessment & Plan Note (Signed)
Stable disease. Dropped oulse ox at 2 laps. Explained disease path again and gavie exercise guidelines  PLAN Please continue daily exercise - make sure heart rate is < 120-125 - you can take your heart rate that high but not higher Make sure pulse ox is always >88% with or without oxygen Use oxygen with exercise and walking and moving around We will set up overnight oxygen study on room air to determine your oxygen need at night Please see Dr. Allyson Sabal as well REturn to see me in 6 months Come sooner if there are problems   20 minutes in face to face counseling

## 2010-11-25 ENCOUNTER — Telehealth: Payer: Self-pay | Admitting: Internal Medicine

## 2010-11-25 DIAGNOSIS — I272 Pulmonary hypertension, unspecified: Secondary | ICD-10-CM

## 2010-11-25 NOTE — Telephone Encounter (Signed)
Please tell patient that she did have mild desaturations at night  So need 1-2 L o2 at night. Total time spent < 88% was 9 minutes with lowest puse ox was 74% and 3 times there was sustained desaturations to < 88% for 3 minutes

## 2010-11-25 NOTE — Telephone Encounter (Signed)
[email or web address].Jennifer Castillo, CMA  

## 2010-11-26 ENCOUNTER — Telehealth: Payer: Self-pay | Admitting: Internal Medicine

## 2010-11-26 NOTE — Telephone Encounter (Signed)
Pt aware and order placed.Julia Lucero, CMA  

## 2010-11-26 NOTE — Telephone Encounter (Signed)
Pt called back stating that she wants to hold off on getting o2 set up for bedtime because she is not sure she wants to stick with [employer]. She states she has some equipment at home that she is having trouble with and she wants to see if [employer] fixes the problem properly and if not then she might switch companies. I called [employer] to cancel order for o2 qhs and also advised them the pt was having some issue with other equipment. Pt states she will call back next week to let us know when it is ok to send order for o2 at bedtime.Carron Curie, CMA

## 2010-11-28 ENCOUNTER — Encounter: Payer: Self-pay | Admitting: Internal Medicine

## 2011-01-13 ENCOUNTER — Telehealth: Payer: Self-pay | Admitting: Internal Medicine

## 2011-01-13 NOTE — Telephone Encounter (Signed)
Noted and will forward to [facility] and MR

## 2011-01-15 NOTE — Telephone Encounter (Signed)
Done and given to [facility]

## 2011-01-16 NOTE — Telephone Encounter (Signed)
Forms at front. Pt aware. Julia Lucero, CMA

## 2011-03-03 ENCOUNTER — Telehealth: Payer: Self-pay | Admitting: Internal Medicine

## 2011-03-03 NOTE — Telephone Encounter (Signed)
Spoke with pt. She states leaving town on 03/21/11 and is flying with o2 so has form that MR needs to fill out. I advised that he is next back in the office on 03/19/11, so if she will go ahead and bring forms we will have him fill out asap. She verbalized understanding.

## 2011-03-06 ENCOUNTER — Telehealth: Payer: Self-pay | Admitting: Internal Medicine

## 2011-03-06 NOTE — Telephone Encounter (Signed)
Victorino Dike has the paperwork for Dr. Marchelle Gearing to fill out

## 2011-03-07 NOTE — Telephone Encounter (Signed)
I placed the paperwork in your look-at. Carron Curie, CMA

## 2011-03-07 NOTE — Telephone Encounter (Signed)
Ok thanks. I will fill it on Monday 03/17/11 when I get back. I am closing encounter no need to reply. Please ensure patient aware

## 2011-03-07 NOTE — Telephone Encounter (Signed)
Pt aware and will close encounter

## 2011-03-07 NOTE — Telephone Encounter (Signed)
MR, pt is not flying out until 10/12.

## 2011-03-07 NOTE — Telephone Encounter (Signed)
If she is flying on or before 03/17/11, please see if TP can sign it.

## 2011-03-12 ENCOUNTER — Ambulatory Visit (INDEPENDENT_AMBULATORY_CARE_PROVIDER_SITE_OTHER): Payer: Medicare Other

## 2011-03-12 DIAGNOSIS — Z23 Encounter for immunization: Secondary | ICD-10-CM

## 2011-03-18 ENCOUNTER — Telehealth: Payer: Self-pay | Admitting: Internal Medicine

## 2011-03-18 NOTE — Telephone Encounter (Signed)
No need for message, already being addressed.

## 2011-03-19 NOTE — Telephone Encounter (Signed)
Forms completed and placed at front. Pt aware. Carron Curie, CMA

## 2011-08-04 ENCOUNTER — Ambulatory Visit (INDEPENDENT_AMBULATORY_CARE_PROVIDER_SITE_OTHER): Payer: Medicare Other | Admitting: Internal Medicine

## 2011-08-04 ENCOUNTER — Encounter: Payer: Self-pay | Admitting: Internal Medicine

## 2011-08-04 VITALS — BP 130/70 | HR 90 | Temp 98.0°F | Ht <= 58 in | Wt 138.0 lb

## 2011-08-04 DIAGNOSIS — I2789 Other specified pulmonary heart diseases: Secondary | ICD-10-CM | POA: Diagnosis not present

## 2011-08-04 NOTE — Assessment & Plan Note (Signed)
CLinically doing very well wihtout overt signs of right heart decompensation. The exertional pulse ox trend appears to be marginally worse compared to sept 2011 and may 2012. WE wil contnue to watch this for the moment.  PLAN Please continue daily exercise - make sure heart rate is < 120-125 - you can take your heart rate that high but not higher Make sure pulse ox is always >88% with or without oxygen Use oxygen with exercise and walking and moving around REturn to see me in 8  Months. Wil monitor her exertional desaturations Come sooner if there are problems

## 2011-08-04 NOTE — Patient Instructions (Addendum)
Please continue daily exercise - make sure heart rate is < 120-125 - you can take your heart rate that high but not higher Make sure pulse ox is always >88% with or without oxygen Use oxygen with exercise and walking and moving around REturn to see me in 8  Months. Wil monitor her exertional desaturations Come sooner if there are problems

## 2011-08-04 NOTE — Progress Notes (Signed)
Subjective:    Patient ID: Julia Lucero, female    DOB: 12-08-1935, 76 y.o.   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 Decompensated Cor Pulmonale and discovery of pulmonary hypertension on ECHO (no right heart cath) following trip to [facility] in March 2011 due to pneumonia (CT chest  march 2011 at [facility] showedRLL consolidation and bialteral effusion). Negative Autoimmune Profile May 2011 except for mild raised CRP and borderline positive rheumatoid factor   March 04, 2010: feels well. Doing rehab. Not desaturating there. Due to fly to [facility] 10/20/2011on [employer]. Lot of questions on  flight o2. WE walked her today 185 feet x 3 laps and lowest o2 was only 92%. This is a huge improvement because in MAy 2011 she did desaturate with exertion. No new complaints. She continues on lasix and amlodipine started by Dr. Allyson Sabal  in Apri 2011. Compliant with medicines. No interim problems. Denies edema, cough, orthopnea, paroxysmal nocturnal dyspnea, wheeze. She has seen Dr. Allyson Sabal in interim and per his records he has resasured her and is following expectantly. REC: EXPECTANT FOLLOWUP  OV 11/01/2010: OVerall well. Has completed rehab. Underwent PT for muscle strengthening but now doing it on own. Unclear if has increasing dyspnea or not. AT times she denies it but at times she states she is marginally worse. Certainly seems to get tired by end of the day which might be baseline. Uses o2 wwiht exertion.  Continues lasix and amlodipine. Not seen Dr. Allyson Sabal in interim either. No worsening edema. No wheeze. No chest pain. No syncope. No weight loss. No pnd. No orthopnea. Had 3 flight trips in interim and did well. Has repeat question on mechanism of restrictive chest disease and pulmonary hypertension. Walking desaturatin test today - 3 laps and dropped to 88% - 87% after 2 laps  REC  Please continue daily exercise -  make sure heart rate is < 120-125 - you can take your heart rate that high but not higher  Make sure pulse ox is always >88% with or without oxygen  Use oxygen with exercise and walking and moving around  We will set up overnight oxygen study on room air to determine your oxygen need at night  Please see Dr. Allyson Sabal as well  REturn to see me in 6 months  Come sooner if there are problems  OV 08/04/2011  OVerall well. Doing exercises 5-6 times per week in 2 sessions daily. Really helping. Overall stable. Class 2 dyspnea with exertion that is stable and relieved by rest. Uses o2 wwiht exertion.  Continues lasix and amlodipine. Not seen Dr. Allyson Sabal in interim either. No worsening edema. No wheeze. No chest pain. No syncope. No weight loss. No pnd. No orthopnea. Had several flight trips in interim to [place] and did well; does DVT precaution exercises. Walking desaturatin test today - 3 laps and dropped to 88% - 87% after 1 laps x 185 feet. The exertional desaturation is marginally worse than last OV. We will keep an eye on this because he is otherwise well  Past, Family, Social reviewed: no change since last visit    Review of Systems  Constitutional: Negative for fever and unexpected weight change.  HENT: Negative for ear pain, nosebleeds, congestion, sore throat, rhinorrhea, sneezing, trouble swallowing, dental problem, postnasal drip and sinus pressure.   Eyes: Negative for redness and itching.  Respiratory: Negative for cough, chest tightness, shortness of breath and wheezing.   Cardiovascular: Negative for  palpitations and leg swelling.  Gastrointestinal: Negative for nausea and vomiting.  Genitourinary: Negative for dysuria.  Musculoskeletal: Negative for joint swelling.  Skin: Negative for rash.  Neurological: Negative for headaches.  Hematological: Does not bruise/bleed easily.  Psychiatric/Behavioral: Negative for dysphoric mood. The patient is not nervous/anxious.          Objective:   Physical Exam General:  short overweight abnormal bony structure - c/w prior rickets Head:  normocephalic and atraumatic Eyes:  PERRLA/EOM intact; conjunctiva and sclera clear Ears:  TMs intact and clear with normal canals Nose:  no deformity, discharge, inflammation, or lesions Mouth:  no deformity or lesions Neck:  no masses, thyromegaly, or abnormal cervical nodes Chest Wall:  abnormal chest cage Lungs:  clear bilaterally to auscultation and percussion Heart:  regular rate and rhythm, S1, S2 without murmurs, rubs, gallops, or clicks  ? LOUD P2 Abdomen:  bowel sounds positive; abdomen soft and non-tender without masses, or organomegaly Msk:  no deformity or scoliosis noted with normal posture. EDEMA NONE THIS VISIT Pulses:  pulses normal Extremities:  no clubbing, cyanosis, edema, or deformity noted Neurologic:  CN II-XII grossly intact with normal reflexes, coordination, muscle strength and tone Skin:  intact without lesions or rashes Cervical Nodes:  no significant adenopathy Axillary Nodes:  no significant adenopathy Psych:  alert and cooperative; normal mood and affect; normal attention span and concentration         Assessment & Plan:

## 2011-09-08 ENCOUNTER — Telehealth: Payer: Self-pay | Admitting: Internal Medicine

## 2011-09-08 NOTE — Telephone Encounter (Signed)
i spoke with pt and she stated she is going to see her eye doctor today for possible eye infection and just wanted to let MR know. Will forward to him as an [place]

## 2011-09-08 NOTE — Telephone Encounter (Signed)
Ok please find out from patient if eye doctor had any concerns in terms of lung health?

## 2011-09-09 DIAGNOSIS — B309 Viral conjunctivitis, unspecified: Secondary | ICD-10-CM | POA: Diagnosis not present

## 2011-09-09 DIAGNOSIS — H2510 Age-related nuclear cataract, unspecified eye: Secondary | ICD-10-CM | POA: Diagnosis not present

## 2011-09-09 DIAGNOSIS — H04129 Dry eye syndrome of unspecified lacrimal gland: Secondary | ICD-10-CM | POA: Diagnosis not present

## 2011-09-10 NOTE — Telephone Encounter (Signed)
I spoke with the pt and she states eye doc did not have any concerns with her lung health. She is on a prescription eye drops and will return in 1 week for recheck. Carron Curie, CMA

## 2011-09-16 DIAGNOSIS — J301 Allergic rhinitis due to pollen: Secondary | ICD-10-CM | POA: Diagnosis not present

## 2011-09-16 DIAGNOSIS — I428 Other cardiomyopathies: Secondary | ICD-10-CM | POA: Diagnosis not present

## 2011-09-16 DIAGNOSIS — I2789 Other specified pulmonary heart diseases: Secondary | ICD-10-CM | POA: Diagnosis not present

## 2011-09-17 ENCOUNTER — Telehealth: Payer: Self-pay | Admitting: Internal Medicine

## 2011-09-17 NOTE — Telephone Encounter (Signed)
Spoke with pt. She is leaving on 09/29/11.

## 2011-09-17 NOTE — Telephone Encounter (Signed)
I found the last one you did and printed it and put in in your lookat attached to the new forms. It looks a little different, but it was the only one. Thanks!

## 2011-09-17 NOTE — Telephone Encounter (Signed)
She typically needs it filled 10 days prior to departure. When is her travel date ?

## 2011-09-17 NOTE — Telephone Encounter (Signed)
Spoke with pt and advised MR out of the office until 09/26/11 and this will be addressed on ar after this date. Pt verbalized understanding and states nothing further needed. Forms placed in MR's lookat. Please advise once completed so we can inform the pt, thanks!

## 2011-09-17 NOTE — Telephone Encounter (Signed)
i have filled these forms in past. They are in centricity or in epic under media. If you can have a copy out of those pre filled ones, will be great . Please put them in my look at. I will get them done next few to several days

## 2011-09-18 DIAGNOSIS — H04129 Dry eye syndrome of unspecified lacrimal gland: Secondary | ICD-10-CM | POA: Diagnosis not present

## 2011-09-19 ENCOUNTER — Telehealth: Payer: Self-pay | Admitting: Internal Medicine

## 2011-09-19 NOTE — Telephone Encounter (Signed)
FYI message for MR--will sign off and forward to make MR aware.

## 2011-09-22 NOTE — Telephone Encounter (Signed)
Per Victorino Dike, form was given to MR when he stopped by the office on 4.12.13 - and has not brought this back as of yet > 3 pm elink this week, then in office on 4.19.13.  Called spoke with patient, informed her that MR has her form and is aware of her departure and that our office will call her when the form is ready.  Pt is okay with this and verbalized her understanding.  Will forward back to MR.

## 2011-09-23 NOTE — Telephone Encounter (Signed)
4/19 pm is when I am in office. If by chance I drop it off before then, I will let you knw

## 2011-09-23 NOTE — Telephone Encounter (Signed)
noted 

## 2011-09-23 NOTE — Telephone Encounter (Signed)
I have her form ready. She can pick it up in elink by calling [phone] and coming to[address], [employer]. Across [address]. I am there 3 to 11. Or I can get into office 4/19 when I come  Call me back

## 2011-09-23 NOTE — Telephone Encounter (Signed)
I spoke with pt and she states she would just prefer to pick this up on 09/26/11. Will forward back to MR so he is aware

## 2011-09-26 ENCOUNTER — Telehealth: Payer: Self-pay | Admitting: Internal Medicine

## 2011-09-26 NOTE — Telephone Encounter (Signed)
Form completed and placed at front. Pt is aware. Carron Curie, CMA

## 2011-10-21 DIAGNOSIS — H20019 Primary iridocyclitis, unspecified eye: Secondary | ICD-10-CM | POA: Diagnosis not present

## 2011-11-10 DIAGNOSIS — Z0000 Encounter for general adult medical examination without abnormal findings: Secondary | ICD-10-CM | POA: Diagnosis not present

## 2011-11-10 DIAGNOSIS — Z01419 Encounter for gynecological examination (general) (routine) without abnormal findings: Secondary | ICD-10-CM | POA: Diagnosis not present

## 2011-11-11 DIAGNOSIS — H04129 Dry eye syndrome of unspecified lacrimal gland: Secondary | ICD-10-CM | POA: Diagnosis not present

## 2011-11-11 DIAGNOSIS — H20019 Primary iridocyclitis, unspecified eye: Secondary | ICD-10-CM | POA: Diagnosis not present

## 2011-11-17 DIAGNOSIS — D239 Other benign neoplasm of skin, unspecified: Secondary | ICD-10-CM | POA: Diagnosis not present

## 2011-11-17 DIAGNOSIS — L408 Other psoriasis: Secondary | ICD-10-CM | POA: Diagnosis not present

## 2011-11-17 DIAGNOSIS — L821 Other seborrheic keratosis: Secondary | ICD-10-CM | POA: Diagnosis not present

## 2011-11-17 DIAGNOSIS — D485 Neoplasm of uncertain behavior of skin: Secondary | ICD-10-CM | POA: Diagnosis not present

## 2011-11-27 DIAGNOSIS — Z1231 Encounter for screening mammogram for malignant neoplasm of breast: Secondary | ICD-10-CM | POA: Diagnosis not present

## 2011-12-17 DIAGNOSIS — B309 Viral conjunctivitis, unspecified: Secondary | ICD-10-CM | POA: Diagnosis not present

## 2011-12-17 DIAGNOSIS — H01009 Unspecified blepharitis unspecified eye, unspecified eyelid: Secondary | ICD-10-CM | POA: Diagnosis not present

## 2011-12-18 ENCOUNTER — Telehealth: Payer: Self-pay | Admitting: Internal Medicine

## 2011-12-18 NOTE — Telephone Encounter (Signed)
I spoke with Julia Lucero and she states she is not flying out until aug 15 and she wanted to make sure MR was going to be in the beginning of august to bring in her airline paperwork. I advised her he would be in 01/12/12 since this has to be filled out 10 days prior to departure. Nothing further was needed

## 2011-12-22 DIAGNOSIS — E782 Mixed hyperlipidemia: Secondary | ICD-10-CM | POA: Diagnosis not present

## 2011-12-22 DIAGNOSIS — R0602 Shortness of breath: Secondary | ICD-10-CM | POA: Diagnosis not present

## 2012-01-12 ENCOUNTER — Telehealth: Payer: Self-pay | Admitting: Internal Medicine

## 2012-01-12 NOTE — Telephone Encounter (Signed)
Forms given to [facility]. Carron Curie, CMA

## 2012-01-13 NOTE — Telephone Encounter (Signed)
Form placed on letter head and put in MR look-at to sign. Carron Curie, CMA

## 2012-01-15 NOTE — Telephone Encounter (Signed)
I spoke with pt and is aware forms where placed upfront for pick up. Nothing further was needed

## 2012-01-15 NOTE — Telephone Encounter (Signed)
Done. I am in Side A; you can come and pick it up from me now

## 2012-02-24 ENCOUNTER — Telehealth: Payer: Self-pay | Admitting: Internal Medicine

## 2012-02-24 NOTE — Telephone Encounter (Signed)
I spoke with pt and stated she is going to have colonoscopy scheduled and wanted to know if she needed to let them know she is oxygen. I advised her did not think it affected this but would just let them know she does use oxygen. Nothing further was needed

## 2012-03-10 DIAGNOSIS — D235 Other benign neoplasm of skin of trunk: Secondary | ICD-10-CM | POA: Diagnosis not present

## 2012-03-10 DIAGNOSIS — D1801 Hemangioma of skin and subcutaneous tissue: Secondary | ICD-10-CM | POA: Diagnosis not present

## 2012-03-10 DIAGNOSIS — L905 Scar conditions and fibrosis of skin: Secondary | ICD-10-CM | POA: Diagnosis not present

## 2012-03-10 DIAGNOSIS — L821 Other seborrheic keratosis: Secondary | ICD-10-CM | POA: Diagnosis not present

## 2012-03-30 ENCOUNTER — Encounter: Payer: Self-pay | Admitting: Internal Medicine

## 2012-03-30 ENCOUNTER — Ambulatory Visit (INDEPENDENT_AMBULATORY_CARE_PROVIDER_SITE_OTHER): Payer: Medicare Other | Admitting: Internal Medicine

## 2012-03-30 VITALS — BP 108/72 | HR 88 | Temp 97.7°F | Ht <= 58 in | Wt 136.0 lb

## 2012-03-30 DIAGNOSIS — I2789 Other specified pulmonary heart diseases: Secondary | ICD-10-CM | POA: Diagnosis not present

## 2012-03-30 DIAGNOSIS — Z23 Encounter for immunization: Secondary | ICD-10-CM

## 2012-03-30 NOTE — Progress Notes (Signed)
Subjective:    Patient ID: Julia Lucero, female    DOB: June 07, 1936, 76 y.o.   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 Decompensated Cor Pulmonale and discovery of pulmonary hypertension on ECHO (no right heart cath) following trip to [facility] in March 2011 due to pneumonia (CT chest  march 2011 at [facility] showedRLL consolidation and bialteral effusion). Negative Autoimmune Profile May 2011 except for mild raised CRP and borderline positive rheumatoid factor   March 04, 2010: feels well. Doing rehab. Not desaturating there. Due to fly to [facility] 10/20/2011on [employer]. Lot of questions on  flight o2. WE walked her today 185 feet x 3 laps and lowest o2 was only 92%. This is a huge improvement because in MAy 2011 she did desaturate with exertion. No new complaints. She continues on lasix and amlodipine started by Dr. Allyson Sabal  in Apri 2011. Compliant with medicines. No interim problems. Denies edema, cough, orthopnea, paroxysmal nocturnal dyspnea, wheeze. She has seen Dr. Allyson Sabal in interim and per his records he has resasured her and is following expectantly. REC: EXPECTANT FOLLOWUP  OV 11/01/2010: OVerall well. Has completed rehab. Underwent PT for muscle strengthening but now doing it on own. Unclear if has increasing dyspnea or not. AT times she denies it but at times she states she is marginally worse. Certainly seems to get tired by end of the day which might be baseline. Uses o2 wwiht exertion.  Continues lasix and amlodipine. Not seen Dr. Allyson Sabal in interim either. No worsening edema. No wheeze. No chest pain. No syncope. No weight loss. No pnd. No orthopnea. Had 3 flight trips in interim and did well. Has repeat question on mechanism of restrictive chest disease and pulmonary hypertension. Walking desaturatin test today - and dropped to 88% - 87% after 2 laps  REC  Please continue daily exercise - make sure  heart rate is < 120-125 - you can take your heart rate that high but not higher  Make sure pulse ox is always >88% with or without oxygen  Use oxygen with exercise and walking and moving around  We will set up overnight oxygen study on room air to determine your oxygen need at night  Please see Dr. Allyson Sabal as well  REturn to see me in 6 months  Come sooner if there are problems  OV 08/04/2011  OVerall well. Doing exercises 5-6 times per week in 2 sessions daily. Really helping. Overall stable. Class 2 dyspnea with exertion that is stable and relieved by rest. Uses o2 wwiht exertion.  Continues lasix and amlodipine. Not seen Dr. Allyson Sabal in interim either. No worsening edema. No wheeze. No chest pain. No syncope. No weight loss. No pnd. No orthopnea. Had several flight trips in interim to [place] and did well; does DVT precaution exercises. Walking desaturatin test today - and dropped to 88% - 87% after 1 laps x 185 feet. The exertional desaturation is marginally worse than last OV. We will keep an eye on this because she is otherwise well  Past, Family, Social reviewed: no change since last visit   Please continue daily exercise - make sure heart rate is < 120-125 - you can take your heart rate that high but not higher  Make sure pulse ox is always >88% with or without oxygen  Use oxygen with exercise and walking and moving around  REturn to see me in 8 Months. Wil monitor her exertional desaturations  Come sooner if  there are problems   OV 03/30/2012  Resp wise stable. Says she feels hollow inside. Unable to explain. Spiritually weary. No other iesues. Walking desaturation test on 03/30/2012 185 feet x 1 laps:  did desaturate. Rest pulse ox was 94%, final pulse ox was 88%. HR response 93/min at rest to 114/min at peak exertion at 1 laps. The distance to desaturation is similar to Feb 2013 but worse since May 2012  Review of Systems  Constitutional: Negative for fever and unexpected weight  change.  HENT: Negative for ear pain, nosebleeds, congestion, sore throat, rhinorrhea, sneezing, trouble swallowing, dental problem, postnasal drip and sinus pressure.   Eyes: Negative for redness and itching.  Respiratory: Negative for cough, chest tightness, shortness of breath and wheezing.   Cardiovascular: Negative for palpitations and leg swelling.  Gastrointestinal: Negative for nausea and vomiting.  Genitourinary: Negative for dysuria.  Musculoskeletal: Negative for joint swelling.  Skin: Negative for rash.  Neurological: Negative for headaches.  Hematological: Does not bruise/bleed easily.  Psychiatric/Behavioral: Negative for dysphoric mood. The patient is not nervous/anxious.        Objective:   Physical Exam General:  short overweight abnormal bony structure - c/w prior rickets Head:  normocephalic and atraumatic Eyes:  PERRLA/EOM intact; conjunctiva and sclera clear Ears:  TMs intact and clear with normal canals Nose:  no deformity, discharge, inflammation, or lesions Mouth:  no deformity or lesions Neck:  no masses, thyromegaly, or abnormal cervical nodes Chest Wall:  abnormal chest cage Lungs:  clear bilaterally to auscultation and percussion Heart:  regular rate and rhythm, S1, S2 without murmurs, rubs, gallops, or clicks  ? LOUD P2 Abdomen:  bowel sounds positive; abdomen soft and non-tender without masses, or organomegaly Msk:  no deformity or scoliosis noted with normal posture. EDEMA NONE THIS VISIT Pulses:  pulses normal Extremities:  no clubbing, cyanosis, edema, or deformity noted Neurologic:  CN II-XII grossly intact with normal reflexes, coordination, muscle strength and tone Skin:  intact without lesions or rashes Cervical Nodes:  no significant adenopathy Axillary Nodes:  no significant adenopathy Psych:  alert and cooperative; normal mood and affect; normal attention span and concentration         Assessment & Plan:

## 2012-03-30 NOTE — Patient Instructions (Addendum)
Please have flu shot today 03/30/2012 I will touch base with Dr Allyson Sabal and get back to you next week regarding your symptoms

## 2012-04-18 ENCOUNTER — Telehealth: Payer: Self-pay | Admitting: Internal Medicine

## 2012-04-18 NOTE — Assessment & Plan Note (Signed)
Rickets as a child. AT baseline has some fatigue and feelings of having to concentrate on walking since 2008/2009 but still managing 2h aerobics at home. Otherwise well. [place]  through 09/03/2009 for vacation.. Was well prior to trip and during first part of trip to [place].  During  2nd half of trip felt some sinus congestion. Took some OTC chlortrimeton antihistamine but it did not clear up symptoms. During this time started noticing insidious onset dyspnea on exertion esp at airport on way back. During change over in [place] airport, dyspnea was significant enough that she needed wheelchair. Subsequently went to toilet in plane and was 'feeling awful'. Husband through she looked awful, and per husband she passed out. Got basic first aid in plane (Orange Juice and  O2) and felt better. At landing she declined EMS. Saw Dr. Jacky Kindle next day on 09/04/2009. Noted to be hypoxemic 80% RA. -> CT PA gram showed cardiomegagly, bilateral effusion and RLL consolidation (per my review). She saw  Dr Allyson Sabal and was  started on  low dose diuresis samde day. ECHO 09/04/2009 showed LVEF >55% with impaired relaxation but also flattened septum reflectiv of RVSP of .  Followed with Dr. Allyson Sabal on 09/07/2009 and was feeling better. Asked to continue diuresis and started on amlodipine   - PFTs 09/21/2009 - REstriction p FVC 1.25L/56%, TLC 2.87L/76%, DLCO 7.7/47%,   - WAlkied 185 feet x 3 laps in officeon 09/11/2009  and dropped to 90%  - Waked 185 feet x 2 laps office 11/02/2009 -> Dropped to 85%  - CT 11/02/2009 - effusion and consolidation resolved  - Sept 2011: Walked 185 feet x 3 laps: lowest pulse ox only 92% -> Expectant followup - May 2012: Walked 185 feet x 2 laps: dropped to 87% -> ADVISED to continue o2 and set up ono - Feb 2013 - Walked 185 feet x  1 lap: dropped to 88% on RA. -> contnue expectant followup.  -  Oct 2013 - walked 185 feet x 1 lap: desautrated on RA  Her disctance to  dessaurtaion is stable since last visit but over a year has declined compared to baseline.I touched base with Dr Erlene Quan via email and he will see patient again

## 2012-04-18 NOTE — Telephone Encounter (Signed)
Julia Lucero  Please tell patient to see dr Allyson Sabal. Their office should have called to make sure things are ok from heart stand point. YOu can give her an appt to see me in 6 months or so

## 2012-04-20 NOTE — Telephone Encounter (Signed)
Pt states she was contacted by Dr. Renelda Mom office, but she had to leave a message when she called back and she has not heard anything further. I advised the pt to contact them again. Pt states she will do so now. Also reminder placed for 6 months. Carron Curie, CMA

## 2012-04-27 ENCOUNTER — Other Ambulatory Visit (HOSPITAL_COMMUNITY): Payer: Self-pay | Admitting: Cardiovascular Disease

## 2012-04-27 DIAGNOSIS — R0602 Shortness of breath: Secondary | ICD-10-CM

## 2012-04-27 DIAGNOSIS — I10 Essential (primary) hypertension: Secondary | ICD-10-CM

## 2012-05-11 ENCOUNTER — Ambulatory Visit (HOSPITAL_COMMUNITY)
Admission: RE | Admit: 2012-05-11 | Discharge: 2012-05-11 | Disposition: A | Discharge status: Home / Self Care | Payer: Medicare Other | Source: Ambulatory Visit | Attending: Cardiovascular Disease | Admitting: Cardiovascular Disease

## 2012-05-11 DIAGNOSIS — I2789 Other specified pulmonary heart diseases: Secondary | ICD-10-CM | POA: Insufficient documentation

## 2012-05-11 DIAGNOSIS — R0602 Shortness of breath: Secondary | ICD-10-CM | POA: Insufficient documentation

## 2012-05-11 DIAGNOSIS — I10 Essential (primary) hypertension: Secondary | ICD-10-CM

## 2012-05-11 NOTE — Progress Notes (Signed)
2D Echo Performed 05/11/2012    Daschel Roughton, RCS  

## 2012-05-21 DIAGNOSIS — R0602 Shortness of breath: Secondary | ICD-10-CM | POA: Diagnosis not present

## 2012-06-28 ENCOUNTER — Telehealth: Payer: Self-pay | Admitting: Internal Medicine

## 2012-06-28 DIAGNOSIS — Z800 Family history of malignant neoplasm of digestive organs: Secondary | ICD-10-CM

## 2012-06-28 NOTE — Telephone Encounter (Signed)
I spoke with pt and she stated she is due for colonoscopy. She usually has this done over at [facility] in [place]. They advised since she is in oxygen to have this done in a hospital setting or in an office that provides this. Pt is wanting to know if MR can refer her to someone in the [facility] system that can do this for her. Please advise thanks

## 2012-06-30 ENCOUNTER — Encounter: Payer: Self-pay | Admitting: Internal Medicine

## 2012-06-30 NOTE — Telephone Encounter (Signed)
Please send a referral to [facility] gastroenterology services. Either Dr. Stan Head or Dr. Shela Commons. Rhea Belton

## 2012-06-30 NOTE — Telephone Encounter (Signed)
Pt aware and referral was placed.

## 2012-07-22 ENCOUNTER — Ambulatory Visit: Payer: Medicare Other | Admitting: Internal Medicine

## 2012-07-26 ENCOUNTER — Telehealth: Payer: Self-pay | Admitting: Internal Medicine

## 2012-07-26 ENCOUNTER — Encounter: Payer: Self-pay | Admitting: Internal Medicine

## 2012-07-27 ENCOUNTER — Telehealth: Payer: Self-pay | Admitting: Internal Medicine

## 2012-07-27 ENCOUNTER — Encounter: Payer: Self-pay | Admitting: Internal Medicine

## 2012-07-27 ENCOUNTER — Ambulatory Visit (INDEPENDENT_AMBULATORY_CARE_PROVIDER_SITE_OTHER): Payer: Medicare Other | Admitting: Internal Medicine

## 2012-07-27 VITALS — BP 120/62 | HR 84 | Ht <= 58 in | Wt 135.5 lb

## 2012-07-27 DIAGNOSIS — Z86010 Personal history of colonic polyps: Secondary | ICD-10-CM | POA: Diagnosis not present

## 2012-07-27 DIAGNOSIS — Z8639 Personal history of other endocrine, nutritional and metabolic disease: Secondary | ICD-10-CM

## 2012-07-27 DIAGNOSIS — Z800 Family history of malignant neoplasm of digestive organs: Secondary | ICD-10-CM

## 2012-07-27 DIAGNOSIS — Z862 Personal history of diseases of the blood and blood-forming organs and certain disorders involving the immune mechanism: Secondary | ICD-10-CM | POA: Diagnosis not present

## 2012-07-27 NOTE — Progress Notes (Addendum)
Patient ID: Julia Lucero, female   DOB: [birth year], 77 y.o.   MRN: [id number]  SUBJECTIVE: HPI Mrs. Kundert is a 77 year old female with a past medical history of pulmonary hypertension on home oxygen and rickets who seen in consultation at the request of Dr. Marchelle Gearing for evaluation of elevated risk screening colonoscopy.  The patient is without GI complaint today including no abdominal pain. No change in bowel habits. No diarrhea or constipation. No blood in her stool nor melena. She has a history of flex will sigmoidoscopy and colonoscopy, in the past performed in [facility]. She feels her last test was 3-1/2 years ago.  She does not recall the recommended repeat interval, but feels it was either 3 or 5 years. Since her last colonoscopy she has been placed on home oxygen for her pulmonary hypertension. She denies chest pain or shortness of breath above baseline today.  Review of Systems  As per history of present illness, otherwise negative   Past Medical History  Diagnosis Date  . Disorder of bone and cartilage, unspecified   . Chronic fatigue   .  rickets/metabolic bone disease      Current Outpatient Prescriptions  Medication Sig Dispense Refill  . amLODipine (NORVASC) 5 MG tablet Take 5 mg by mouth daily.        . chlorpheniramine (CHLOR-TRIMETON) 4 MG tablet Take 4 mg by mouth 2 (two) times daily as needed.        . clobetasol (TEMOVATE) 0.05 % cream Apply topically 2 (two) times daily as needed.        . furosemide (LASIX) 40 MG tablet Take 20 mg by mouth daily.      . Multiple Vitamin (MULTIVITAMIN) capsule Take 1 capsule by mouth daily.        . Oxygen Permeable Lens Products SOLN by Does not apply route.       No current facility-administered medications for this visit.    Allergies  Allergen Reactions  . Penicillins   . Sulfonamide Derivatives     Family History  Problem Relation Age of Onset  . Diabetes Father   . Colon cancer Brother     History   Substance Use Topics  . Smoking status: Never Smoker   . Smokeless tobacco: Never Used  . Alcohol Use: Not on file    OBJECTIVE: BP 120/62  Pulse 84  Ht 4\' 9"  (1.448 m)  Wt 135 lb 8 oz (61.462 kg)  BMI 29.31 kg/m2  SpO2 98% Constitutional: No acute distress, on oxygen HEENT: Normocephalic and atraumatic. Oropharynx is clear and moist. No oropharyngeal exudate. Conjunctivae are normal. No scleral icterus. Neck: Neck supple. Trachea midline. Cardiovascular: Normal rate, regular rhythm and intact distal pulses. SEM Pulmonary/chest: Effort normal and breath sounds normal. No wheezing, rales or rhonchi. Abdominal: Soft, nontender, nondistended. Bowel sounds active throughout.  Extremities: no clubbing, cyanosis, or edema, notable bony deformities of the upper extremities compatible with history of rickets Neurological: Alert and oriented to Lucero place and time. Skin: Skin is warm and dry. No rashes noted. Psychiatric: Normal mood and affect. Behavior is normal.   ASSESSMENT AND PLAN: 77 year old female with a past medical history of pulmonary hypertension on home oxygen and rickets who seen in consultation at the request of Dr. Marchelle Gearing for evaluation of elevated risk screening colonoscopy.  1. elevated risk screening colonoscopy -- we need to determine when her last screening colonoscopy was in the findings of this test. If she did not have 3 or  greater adenomatous polyps, or one polyp greater than a centimeter at her last test, she would likely not need a repeat colonoscopy until 5 years. Guidelines still support 5 year screening interval for people with family history of colon cancer in a first-degree relative (her brother had colon cancer).  We have requested copies of her prior procedures, we'll review them, and determine when she is due repeat screening colonoscopy.  If she is indeed due now, we will arrange this test in the outpatient hospital setting given her chronic home oxygen  use.  Otherwise, she would likely be due a repeat colonoscopy about 18 months from now. We will contact her with out recommendation once these records have been reviewed.  She understands and is happy with this plan  Addendum Records received from Dr. Janna Arch, MD regarding previous colonoscopy. Colonoscopy performed 03/20/2009 -- exam to the cecum with fair preparation. Polyp removed from the cecum, polyp removed from the proximal transverse, polyp removed in the descending colon -- pathology = 3 separate tubular adenomas --Based on these findings and current guidelines, colonoscopy was indicated for surveillance 3 years from her last which would've been October 2013. She is therefore due now and repeat colonoscopy will be scheduled in hospital setting with MAC sedation given chronic home oxygen use

## 2012-07-27 NOTE — Patient Instructions (Addendum)
Once receive your records Dr. Rhea Belton will review and make decision about when you are due for a colonoscopy.   Please call our office with previous practices name [phone]

## 2012-07-28 NOTE — Telephone Encounter (Signed)
Forms completed and placed at front. Pt is aware. Jennifer Castillo, CMA  

## 2012-07-29 DIAGNOSIS — Z86010 Personal history of colonic polyps: Secondary | ICD-10-CM | POA: Insufficient documentation

## 2012-07-29 DIAGNOSIS — Z860101 Personal history of adenomatous and serrated colon polyps: Secondary | ICD-10-CM | POA: Insufficient documentation

## 2012-08-06 ENCOUNTER — Other Ambulatory Visit: Payer: Self-pay | Admitting: Gastroenterology

## 2012-08-06 ENCOUNTER — Telehealth: Payer: Self-pay | Admitting: Gastroenterology

## 2012-08-06 DIAGNOSIS — Z800 Family history of malignant neoplasm of digestive organs: Secondary | ICD-10-CM

## 2012-08-06 NOTE — Telephone Encounter (Signed)
Spoke to pt told her we scheduled her colonoscopy for 08/24/2012. Pt does not want to do it that day, she wants to wait until after March, she said she will call us in late march to schedule it in April.

## 2012-08-10 ENCOUNTER — Encounter: Payer: Medicare Other | Admitting: Internal Medicine

## 2012-08-24 ENCOUNTER — Ambulatory Visit (HOSPITAL_COMMUNITY): Admit: 2012-08-24 | Payer: Self-pay | Admitting: Internal Medicine

## 2012-08-24 ENCOUNTER — Encounter (HOSPITAL_COMMUNITY): Payer: Self-pay

## 2012-08-24 SURGERY — COLONOSCOPY
Anesthesia: Monitor Anesthesia Care

## 2012-09-06 ENCOUNTER — Encounter: Payer: Medicare Other | Admitting: Internal Medicine

## 2012-09-09 NOTE — Telephone Encounter (Signed)
See 08/06/12 note

## 2012-10-20 ENCOUNTER — Ambulatory Visit (INDEPENDENT_AMBULATORY_CARE_PROVIDER_SITE_OTHER): Payer: Medicare Other | Admitting: Internal Medicine

## 2012-10-20 ENCOUNTER — Encounter: Payer: Self-pay | Admitting: Internal Medicine

## 2012-10-20 VITALS — BP 130/70 | HR 84 | Temp 97.0°F | Ht 59.0 in | Wt 135.0 lb

## 2012-10-20 DIAGNOSIS — I279 Pulmonary heart disease, unspecified: Secondary | ICD-10-CM

## 2012-10-20 DIAGNOSIS — I2789 Other specified pulmonary heart diseases: Secondary | ICD-10-CM | POA: Diagnosis not present

## 2012-10-20 DIAGNOSIS — I2781 Cor pulmonale (chronic): Secondary | ICD-10-CM

## 2012-10-20 NOTE — Patient Instructions (Addendum)
#  Respiratory  - clinically you are stable with good oxygen levels for 550 feet of exertion  - suspect some deconditioning over winter; so will re-refer to pulmonary rehabilitation - continue current care  #Fatigue - talk to ARONSON,RICHARD A, MD about checking vitamin D and thyroid levels  #Followup - 9 months or sooner if needed

## 2012-10-20 NOTE — Progress Notes (Signed)
Subjective:    Patient ID: Julia Lucero, female    DOB: 14-Mar-1936, 77 y.o.   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 Decompensated Cor Pulmonale and discovery of pulmonary hypertension on ECHO (no right heart cath) following trip to [facility] in March 2011 due to pneumonia (CT chest  march 2011 at [facility] showedRLL consolidation and bialteral effusion). Negative Autoimmune Profile May 2011 except for mild raised CRP and borderline positive rheumatoid factor   March 04, 2010: feels well. Doing rehab. Not desaturating there. Due to fly to [facility] 10/20/2011on [employer]. Lot of questions on  flight o2. WE walked her today 185 feet x 3 laps and lowest o2 was only 92%. This is a huge improvement because in MAy 2011 she did desaturate with exertion. No new complaints. She continues on lasix and amlodipine started by Dr. Allyson Sabal  in Apri 2011. Compliant with medicines. No interim problems. Denies edema, cough, orthopnea, paroxysmal nocturnal dyspnea, wheeze. She has seen Dr. Allyson Sabal in interim and per his records he has resasured her and is following expectantly. REC: EXPECTANT FOLLOWUP  OV 11/01/2010: OVerall well. Has completed rehab. Underwent PT for muscle strengthening but now doing it on own. Unclear if has increasing dyspnea or not. AT times she denies it but at times she states she is marginally worse. Certainly seems to get tired by end of the day which might be baseline. Uses o2 wwiht exertion.  Continues lasix and amlodipine. Not seen Dr. Allyson Sabal in interim either. No worsening edema. No wheeze. No chest pain. No syncope. No weight loss. No pnd. No orthopnea. Had 3 flight trips in interim and did well. Has repeat question on mechanism of restrictive chest disease and pulmonary hypertension. Walking desaturatin test today - and dropped to 88% - 87% after 2 laps  REC  Please continue daily exercise -  make sure heart rate is < 120-125 - you can take your heart rate that high but not higher  Make sure pulse ox is always >88% with or without oxygen  Use oxygen with exercise and walking and moving around  We will set up overnight oxygen study on room air to determine your oxygen need at night  Please see Dr. Allyson Sabal as well  REturn to see me in 6 months  Come sooner if there are problems  OV 08/04/2011  OVerall well. Doing exercises 5-6 times per week in 2 sessions daily. Really helping. Overall stable. Class 2 dyspnea with exertion that is stable and relieved by rest. Uses o2 wwiht exertion.  Continues lasix and amlodipine. Not seen Dr. Allyson Sabal in interim either. No worsening edema. No wheeze. No chest pain. No syncope. No weight loss. No pnd. No orthopnea. Had several flight trips in interim to [place] and did well; does DVT precaution exercises. Walking desaturatin test today - and dropped to 88% - 87% after 1 laps x 185 feet. The exertional desaturation is marginally worse than last OV. We will keep an eye on this because she is otherwise well  Past, Family, Social reviewed: no change since last visit   Please continue daily exercise - make sure heart rate is < 120-125 - you can take your heart rate that high but not higher  Make sure pulse ox is always >88% with or without oxygen  Use oxygen with exercise and walking and moving around  REturn to see me in 8 Months. Wil monitor her exertional desaturations  Come sooner if there are problems   OV 03/30/2012 Resp wise stable. Says she feels hollow inside. Unable to explain. Spiritually weary. No other iesues. Walking desaturation test on 03/30/2012 185 feet x 1 laps:  did desaturate. Rest pulse ox was 94%, final pulse ox was 88%. HR response 93/min at rest to 114/min at peak exertion at 1 laps. The distance to desaturation is similar to Feb 2013 but worse since May 2012  [facility] Ensure heart ok with Dr Allyson Sabal   10/20/2012 Office Visit   followup  related restrictivecage defect related to rickets   - Last seen in December 2013. After that for my recommendation she did visit cardiology Dr. Allyson Sabal. She and husband recollect having an echocardiogram that is reported as normal per her history. Overall she's stable although she feels that over the span of 2 years she's is a little bit more fatigued than baseline especially early in the morning. She's not sure if she's had her vitamin D and thyroid function tests checked. Also of note, she went to [place] by car and return a few weeks ago. She did take several breaks every few hours and stretch the legs. Upon return she had mild pedal edema that lasted 2 days and then resolved spontaneously. There was no warmth of the leg or hemoptysis or chest pain or undue dyspnea. It is noted that she does have bilateral varicose veins. No other health issues  - Walk test today 10/20/2012: 185 feet x3 laps on room air: Pulse oximetry 86% at the third lap with a heart rate of 107   Past, Family, Social reviewed: no change since last visit     Review of Systems  Constitutional: Negative for fever and unexpected weight change.  HENT: Negative for ear pain, nosebleeds, congestion, sore throat, rhinorrhea, sneezing, trouble swallowing, dental problem, postnasal drip and sinus pressure.   Eyes: Negative for redness and itching.  Respiratory: Positive for shortness of breath (with exertion). Negative for cough, chest tightness and wheezing.   Cardiovascular: Negative for palpitations and leg swelling.  Gastrointestinal: Negative for nausea and vomiting.  Genitourinary: Negative for dysuria.  Musculoskeletal: Negative for joint swelling.  Skin: Negative for rash.  Neurological: Negative for headaches.  Hematological: Does not bruise/bleed easily.  Psychiatric/Behavioral: Negative for dysphoric mood. The patient is not nervous/anxious.        Objective:   Physical Exam  Vitals  reviewed. Constitutional: She is oriented to person, place, and time. She appears well-developed and well-nourished. No distress.  Short   HENT:  Head: Normocephalic and atraumatic.  Right Ear: External ear normal.  Left Ear: External ear normal.  Mouth/Throat: Oropharynx is clear and moist. No oropharyngeal exudate.  Eyes: Conjunctivae and EOM are normal. Pupils are equal, round, and reactive to light. Right eye exhibits no discharge. Left eye exhibits no discharge. No scleral icterus.  Neck: Normal range of motion. Neck supple. No JVD present. No tracheal deviation present. No thyromegaly present.  Cardiovascular: Normal rate, regular rhythm, normal heart sounds and intact distal pulses.  Exam reveals no gallop and no friction rub.   No murmur heard. Pulmonary/Chest: Effort normal and breath sounds normal. No respiratory distress. She has no wheezes. She has no rales. She exhibits no tenderness.  Kyphotic due to old rickets  Abdominal: Soft. Bowel sounds are normal. She exhibits no distension and no mass. There is no tenderness. There is no rebound and no guarding.  Musculoskeletal: Normal range of motion. She exhibits no edema and no tenderness.  Some deformities in joints due to old rickets  Lymphadenopathy:    She has no cervical adenopathy.  Neurological: She is alert and oriented to person, place, and time. She has normal reflexes. No cranial nerve deficit. She exhibits normal muscle tone. Coordination normal.  Skin: Skin is warm and dry. No rash noted. She is not diaphoretic. No erythema. No pallor.  Psychiatric: She has a normal mood and affect. Her behavior is normal. Judgment and thought content normal.          Assessment & Plan:

## 2012-10-26 NOTE — Assessment & Plan Note (Signed)
#  Respiratory  - clinically you are stable with good oxygen levels for 550 feet of exertion  - suspect some deconditioning over winter; so will re-refer to pulmonary rehabilitation - continue current care  #Fatigue - talk to ARONSON,RICHARD A, MD about checking vitamin D and thyroid levels  #Followup - 9 months or sooner if needed    (> 50% of this 15 min visit spent in face to face counseling)

## 2012-11-16 DIAGNOSIS — Z0000 Encounter for general adult medical examination without abnormal findings: Secondary | ICD-10-CM | POA: Diagnosis not present

## 2012-11-16 DIAGNOSIS — R3915 Urgency of urination: Secondary | ICD-10-CM | POA: Diagnosis not present

## 2012-11-16 DIAGNOSIS — Z01419 Encounter for gynecological examination (general) (routine) without abnormal findings: Secondary | ICD-10-CM | POA: Diagnosis not present

## 2012-11-19 ENCOUNTER — Telehealth: Payer: Self-pay | Admitting: Internal Medicine

## 2012-11-19 NOTE — Telephone Encounter (Signed)
I spoke with pt and she stated she will drop it off w/in 10 days of deadline of fill out for MR to fill out. I advised we wouold call her once she does this and MR signs this. Nothing further was needed

## 2012-11-29 DIAGNOSIS — D1801 Hemangioma of skin and subcutaneous tissue: Secondary | ICD-10-CM | POA: Diagnosis not present

## 2012-11-29 DIAGNOSIS — L538 Other specified erythematous conditions: Secondary | ICD-10-CM | POA: Diagnosis not present

## 2012-11-29 DIAGNOSIS — D239 Other benign neoplasm of skin, unspecified: Secondary | ICD-10-CM | POA: Diagnosis not present

## 2012-11-29 DIAGNOSIS — L259 Unspecified contact dermatitis, unspecified cause: Secondary | ICD-10-CM | POA: Diagnosis not present

## 2012-11-29 DIAGNOSIS — L821 Other seborrheic keratosis: Secondary | ICD-10-CM | POA: Diagnosis not present

## 2012-12-15 ENCOUNTER — Encounter (HOSPITAL_COMMUNITY)
Admission: RE | Admit: 2012-12-15 | Discharge: 2012-12-15 | Disposition: A | Discharge status: Home / Self Care | Payer: Medicare Other | Source: Ambulatory Visit | Attending: Internal Medicine | Admitting: Internal Medicine

## 2012-12-15 DIAGNOSIS — Z5189 Encounter for other specified aftercare: Secondary | ICD-10-CM | POA: Insufficient documentation

## 2012-12-15 DIAGNOSIS — R0602 Shortness of breath: Secondary | ICD-10-CM | POA: Insufficient documentation

## 2012-12-15 DIAGNOSIS — I2789 Other specified pulmonary heart diseases: Secondary | ICD-10-CM | POA: Diagnosis not present

## 2012-12-15 NOTE — Progress Notes (Signed)
Julia Lucero arrived here today for orientation to Pulmonary Rehab.  She was ambulatory, using oxygen via nasal canula @ 2LM.  Very neatly dressed, does not appear her stated age.  Skin warm and dry, color good, very pleasant and positive about Pulmonary Rehab.  Heart rate regular, bilateral breath sounds, normal.  No peripheral edema.  She does watch her salt intake closely  We reviewed her health history and medication.  Department expectations and obtainable goal set.  Demonstration and practice of PLB using pulse oximeter.  Patient able to return demonstration satisfactorily. Safety and hand hygiene in the exercise area reviewed with patient.  Patient voices understanding. We have scheduled her walk test 12/16/12 and she will begin exercise on 12/21/12.  We look forward to assisting this very nice lady in obtaining her goal of improving her quality of life. Her PHQ screening was 0, she and her husband travel, plans trip in August to [place].  Her husband is very supportive.  We do not identify any psychosocial  barriers for her participation in the program  Very positive outlook regarding her health.

## 2012-12-16 ENCOUNTER — Encounter (HOSPITAL_COMMUNITY)
Admission: RE | Admit: 2012-12-16 | Discharge: 2012-12-16 | Disposition: A | Discharge status: Home / Self Care | Payer: Medicare Other | Source: Ambulatory Visit | Attending: Internal Medicine | Admitting: Internal Medicine

## 2012-12-16 DIAGNOSIS — I2789 Other specified pulmonary heart diseases: Secondary | ICD-10-CM | POA: Diagnosis not present

## 2012-12-16 DIAGNOSIS — R0602 Shortness of breath: Secondary | ICD-10-CM | POA: Diagnosis not present

## 2012-12-16 DIAGNOSIS — Z5189 Encounter for other specified aftercare: Secondary | ICD-10-CM | POA: Diagnosis not present

## 2012-12-21 ENCOUNTER — Encounter (HOSPITAL_COMMUNITY)
Admission: RE | Admit: 2012-12-21 | Discharge: 2012-12-21 | Disposition: A | Discharge status: Home / Self Care | Payer: Medicare Other | Source: Ambulatory Visit | Attending: Internal Medicine | Admitting: Internal Medicine

## 2012-12-21 DIAGNOSIS — I2789 Other specified pulmonary heart diseases: Secondary | ICD-10-CM | POA: Diagnosis not present

## 2012-12-21 DIAGNOSIS — R0602 Shortness of breath: Secondary | ICD-10-CM | POA: Diagnosis not present

## 2012-12-21 DIAGNOSIS — Z5189 Encounter for other specified aftercare: Secondary | ICD-10-CM | POA: Diagnosis not present

## 2012-12-21 NOTE — Progress Notes (Signed)
First day of exercise in Pulmonary Rehab for Julia Lucero.  She was oriented to equipment use and safety, RPE and Dyspnea Scale.  We were unable to position the recumbent bike due to her shorter frame, she did exercise on the stepper x 2 and the walking track.  We will continue to encourage and support. Cathie Olden RN

## 2012-12-23 ENCOUNTER — Encounter (HOSPITAL_COMMUNITY)
Admission: RE | Admit: 2012-12-23 | Discharge: 2012-12-23 | Disposition: A | Discharge status: Home / Self Care | Payer: Medicare Other | Source: Ambulatory Visit | Attending: Internal Medicine | Admitting: Internal Medicine

## 2012-12-23 DIAGNOSIS — I2789 Other specified pulmonary heart diseases: Secondary | ICD-10-CM | POA: Diagnosis not present

## 2012-12-23 DIAGNOSIS — Z5189 Encounter for other specified aftercare: Secondary | ICD-10-CM | POA: Diagnosis not present

## 2012-12-23 DIAGNOSIS — R0602 Shortness of breath: Secondary | ICD-10-CM | POA: Diagnosis not present

## 2012-12-27 ENCOUNTER — Other Ambulatory Visit (HOSPITAL_COMMUNITY): Payer: Self-pay | Admitting: Cardiovascular Disease

## 2012-12-28 ENCOUNTER — Encounter (HOSPITAL_COMMUNITY)
Admission: RE | Admit: 2012-12-28 | Discharge: 2012-12-28 | Disposition: A | Discharge status: Home / Self Care | Payer: Medicare Other | Source: Ambulatory Visit | Attending: Internal Medicine | Admitting: Internal Medicine

## 2012-12-28 DIAGNOSIS — Z5189 Encounter for other specified aftercare: Secondary | ICD-10-CM | POA: Diagnosis not present

## 2012-12-28 DIAGNOSIS — R0602 Shortness of breath: Secondary | ICD-10-CM | POA: Diagnosis not present

## 2012-12-28 DIAGNOSIS — I2789 Other specified pulmonary heart diseases: Secondary | ICD-10-CM | POA: Diagnosis not present

## 2012-12-30 ENCOUNTER — Encounter (HOSPITAL_COMMUNITY)
Admission: RE | Admit: 2012-12-30 | Discharge: 2012-12-30 | Disposition: A | Discharge status: Home / Self Care | Payer: Medicare Other | Source: Ambulatory Visit | Attending: Internal Medicine | Admitting: Internal Medicine

## 2012-12-30 DIAGNOSIS — Z5189 Encounter for other specified aftercare: Secondary | ICD-10-CM | POA: Diagnosis not present

## 2012-12-30 DIAGNOSIS — R0602 Shortness of breath: Secondary | ICD-10-CM | POA: Diagnosis not present

## 2012-12-30 DIAGNOSIS — I2789 Other specified pulmonary heart diseases: Secondary | ICD-10-CM | POA: Diagnosis not present

## 2012-12-30 NOTE — Progress Notes (Signed)
Silver Engebretson 77 y.o. female Nutrition Note Spoke with pt. Pt well-known to this Clinical research associate from previous admission. There are some ways the pt can make her eating habits healthier.  Pt's Rate Your Plate results reviewed with pt. Per nutrition screen, pt reports she wants to lose 5-10 lb. Per discussion with pt, pt has no plan to try to lose wt and would rather focus on "not gaining wt." Pt avoids many salty foods; regular uses canned tomatoes.  Pt adds salt to food.  The role of sodium in lung disease reviewed with pt. Pt expressed understanding of the information reviewed. Nutrition Diagnosis   Food-and nutrition-related knowledge deficit related to lack of exposure to information as related to diagnosis of pulmonary disease   Limited adherence to nutrition-related recommendations related to poor understanding or disinterest as evidenced by food history. Nutrition Rx/Est. Daily Nutrition Needs for: ? wt maintenance 1400-1600 Kcal  75-90 gm protein   1500 mg or less sodium      Nutrition Intervention   Benefits of adopting healthy eating habits discussed when pt's Rate Your Plate reviewed.   Pt to attend the Nutrition and Lung Disease class   Continual client-centered nutrition education by RD, as part of interdisciplinary care. Goal(s) 1. Describe the benefit of including fruits, vegetables, whole grains, and low-fat dairy products in a healthy meal plan. Monitor and Evaluate progress toward nutrition goal with team.   Mickle Plumb, M.Ed, RD, LDN, CDE 12/30/2012 2:52 PM

## 2013-01-03 ENCOUNTER — Telehealth: Payer: Self-pay | Admitting: Internal Medicine

## 2013-01-04 ENCOUNTER — Encounter (HOSPITAL_COMMUNITY)
Admission: RE | Admit: 2013-01-04 | Discharge: 2013-01-04 | Disposition: A | Discharge status: Home / Self Care | Payer: Medicare Other | Source: Ambulatory Visit | Attending: Internal Medicine | Admitting: Internal Medicine

## 2013-01-04 DIAGNOSIS — Z5189 Encounter for other specified aftercare: Secondary | ICD-10-CM | POA: Diagnosis not present

## 2013-01-04 DIAGNOSIS — I2789 Other specified pulmonary heart diseases: Secondary | ICD-10-CM | POA: Diagnosis not present

## 2013-01-04 DIAGNOSIS — R0602 Shortness of breath: Secondary | ICD-10-CM | POA: Diagnosis not present

## 2013-01-06 ENCOUNTER — Encounter (HOSPITAL_COMMUNITY)
Admission: RE | Admit: 2013-01-06 | Discharge: 2013-01-06 | Disposition: A | Discharge status: Home / Self Care | Payer: Medicare Other | Source: Ambulatory Visit | Attending: Internal Medicine | Admitting: Internal Medicine

## 2013-01-06 DIAGNOSIS — Z5189 Encounter for other specified aftercare: Secondary | ICD-10-CM | POA: Diagnosis not present

## 2013-01-06 DIAGNOSIS — R0602 Shortness of breath: Secondary | ICD-10-CM | POA: Diagnosis not present

## 2013-01-06 DIAGNOSIS — I2789 Other specified pulmonary heart diseases: Secondary | ICD-10-CM | POA: Diagnosis not present

## 2013-01-07 NOTE — Telephone Encounter (Signed)
Paperwork completed and placed at front. LMTCBx1 to advise the pt. Carron Curie, CMA

## 2013-01-10 NOTE — Telephone Encounter (Signed)
Pt aware.

## 2013-01-11 ENCOUNTER — Encounter (HOSPITAL_COMMUNITY)
Admission: RE | Admit: 2013-01-11 | Discharge: 2013-01-11 | Disposition: A | Discharge status: Home / Self Care | Payer: Medicare Other | Source: Ambulatory Visit | Attending: Internal Medicine | Admitting: Internal Medicine

## 2013-01-11 DIAGNOSIS — Z5189 Encounter for other specified aftercare: Secondary | ICD-10-CM | POA: Insufficient documentation

## 2013-01-11 DIAGNOSIS — I2789 Other specified pulmonary heart diseases: Secondary | ICD-10-CM | POA: Diagnosis not present

## 2013-01-11 DIAGNOSIS — R0602 Shortness of breath: Secondary | ICD-10-CM | POA: Insufficient documentation

## 2013-01-13 ENCOUNTER — Encounter (HOSPITAL_COMMUNITY)
Admission: RE | Admit: 2013-01-13 | Discharge: 2013-01-13 | Disposition: A | Discharge status: Home / Self Care | Payer: Medicare Other | Source: Ambulatory Visit | Attending: Internal Medicine | Admitting: Internal Medicine

## 2013-01-18 ENCOUNTER — Encounter (HOSPITAL_COMMUNITY): Payer: Medicare Other

## 2013-01-20 ENCOUNTER — Encounter (HOSPITAL_COMMUNITY): Payer: Medicare Other

## 2013-01-25 ENCOUNTER — Encounter (HOSPITAL_COMMUNITY): Payer: Medicare Other

## 2013-01-27 ENCOUNTER — Encounter (HOSPITAL_COMMUNITY): Payer: Medicare Other

## 2013-02-01 ENCOUNTER — Encounter (HOSPITAL_COMMUNITY): Payer: Medicare Other

## 2013-02-03 ENCOUNTER — Encounter (HOSPITAL_COMMUNITY): Payer: Medicare Other

## 2013-02-08 ENCOUNTER — Encounter (HOSPITAL_COMMUNITY)
Admission: RE | Admit: 2013-02-08 | Discharge: 2013-02-08 | Disposition: A | Discharge status: Home / Self Care | Payer: Medicare Other | Source: Ambulatory Visit | Attending: Internal Medicine | Admitting: Internal Medicine

## 2013-02-08 DIAGNOSIS — R0602 Shortness of breath: Secondary | ICD-10-CM | POA: Diagnosis not present

## 2013-02-08 DIAGNOSIS — Z5189 Encounter for other specified aftercare: Secondary | ICD-10-CM | POA: Insufficient documentation

## 2013-02-08 DIAGNOSIS — I2789 Other specified pulmonary heart diseases: Secondary | ICD-10-CM | POA: Insufficient documentation

## 2013-02-10 ENCOUNTER — Encounter (HOSPITAL_COMMUNITY): Payer: Self-pay

## 2013-02-10 ENCOUNTER — Encounter (HOSPITAL_COMMUNITY)
Admission: RE | Admit: 2013-02-10 | Discharge: 2013-02-10 | Disposition: A | Discharge status: Home / Self Care | Payer: Medicare Other | Source: Ambulatory Visit | Attending: Internal Medicine | Admitting: Internal Medicine

## 2013-02-10 NOTE — Progress Notes (Signed)
I have reviewed a Home Exercise Program with the patient.  Julia Lucero will be walking outside in her neighborhood for her exercise.  The patient is advised to walk 10 minutes 3-4 days a week on days that she does not attend Pulmonary Rehab. I have instructed the patient on how to progress walking time and distance.  We reviewed exercise guidelines, target heart rate during exercise, oxygen use, weather, home pulse oximeter, endpoints for exercise, and goals.  Patient has stated that they understand and are encouraged to come to me with any questions.

## 2013-02-15 ENCOUNTER — Encounter (HOSPITAL_COMMUNITY)
Admission: RE | Admit: 2013-02-15 | Discharge: 2013-02-15 | Disposition: A | Discharge status: Home / Self Care | Payer: Medicare Other | Source: Ambulatory Visit | Attending: Internal Medicine | Admitting: Internal Medicine

## 2013-02-17 ENCOUNTER — Encounter (HOSPITAL_COMMUNITY)
Admission: RE | Admit: 2013-02-17 | Discharge: 2013-02-17 | Disposition: A | Discharge status: Home / Self Care | Payer: Medicare Other | Source: Ambulatory Visit | Attending: Internal Medicine | Admitting: Internal Medicine

## 2013-02-22 ENCOUNTER — Encounter (HOSPITAL_COMMUNITY)
Admission: RE | Admit: 2013-02-22 | Discharge: 2013-02-22 | Disposition: A | Discharge status: Home / Self Care | Payer: Medicare Other | Source: Ambulatory Visit | Attending: Internal Medicine | Admitting: Internal Medicine

## 2013-02-24 ENCOUNTER — Encounter (HOSPITAL_COMMUNITY)
Admission: RE | Admit: 2013-02-24 | Discharge: 2013-02-24 | Disposition: A | Discharge status: Home / Self Care | Payer: Medicare Other | Source: Ambulatory Visit | Attending: Internal Medicine | Admitting: Internal Medicine

## 2013-03-01 ENCOUNTER — Encounter (HOSPITAL_COMMUNITY)
Admission: RE | Admit: 2013-03-01 | Discharge: 2013-03-01 | Disposition: A | Discharge status: Home / Self Care | Payer: Medicare Other | Source: Ambulatory Visit | Attending: Internal Medicine | Admitting: Internal Medicine

## 2013-03-03 ENCOUNTER — Encounter (HOSPITAL_COMMUNITY)
Admission: RE | Admit: 2013-03-03 | Discharge: 2013-03-03 | Disposition: A | Discharge status: Home / Self Care | Payer: Medicare Other | Source: Ambulatory Visit | Attending: Internal Medicine | Admitting: Internal Medicine

## 2013-03-08 ENCOUNTER — Encounter (HOSPITAL_COMMUNITY)
Admission: RE | Admit: 2013-03-08 | Discharge: 2013-03-08 | Disposition: A | Discharge status: Home / Self Care | Payer: Medicare Other | Source: Ambulatory Visit | Attending: Internal Medicine | Admitting: Internal Medicine

## 2013-03-09 ENCOUNTER — Ambulatory Visit (INDEPENDENT_AMBULATORY_CARE_PROVIDER_SITE_OTHER): Payer: Medicare Other

## 2013-03-09 DIAGNOSIS — Z23 Encounter for immunization: Secondary | ICD-10-CM

## 2013-03-10 ENCOUNTER — Encounter (HOSPITAL_COMMUNITY)
Admission: RE | Admit: 2013-03-10 | Discharge: 2013-03-10 | Disposition: A | Discharge status: Home / Self Care | Payer: Medicare Other | Source: Ambulatory Visit | Attending: Internal Medicine | Admitting: Internal Medicine

## 2013-03-10 DIAGNOSIS — I2789 Other specified pulmonary heart diseases: Secondary | ICD-10-CM | POA: Diagnosis not present

## 2013-03-10 DIAGNOSIS — Z5189 Encounter for other specified aftercare: Secondary | ICD-10-CM | POA: Insufficient documentation

## 2013-03-10 DIAGNOSIS — R0602 Shortness of breath: Secondary | ICD-10-CM | POA: Diagnosis not present

## 2013-03-15 ENCOUNTER — Encounter (HOSPITAL_COMMUNITY)
Admission: RE | Admit: 2013-03-15 | Discharge: 2013-03-15 | Disposition: A | Discharge status: Home / Self Care | Payer: Medicare Other | Source: Ambulatory Visit | Attending: Internal Medicine | Admitting: Internal Medicine

## 2013-03-17 ENCOUNTER — Encounter (HOSPITAL_COMMUNITY)
Admission: RE | Admit: 2013-03-17 | Discharge: 2013-03-17 | Disposition: A | Discharge status: Home / Self Care | Payer: Medicare Other | Source: Ambulatory Visit | Attending: Internal Medicine | Admitting: Internal Medicine

## 2013-03-22 ENCOUNTER — Encounter (HOSPITAL_COMMUNITY): Payer: Medicare Other

## 2013-03-24 ENCOUNTER — Encounter (HOSPITAL_COMMUNITY)
Admission: RE | Admit: 2013-03-24 | Discharge: 2013-03-24 | Disposition: A | Discharge status: Home / Self Care | Payer: Medicare Other | Source: Ambulatory Visit | Attending: Internal Medicine | Admitting: Internal Medicine

## 2013-03-29 ENCOUNTER — Encounter (HOSPITAL_COMMUNITY)
Admission: RE | Admit: 2013-03-29 | Discharge: 2013-03-29 | Disposition: A | Discharge status: Home / Self Care | Payer: Medicare Other | Source: Ambulatory Visit | Attending: Internal Medicine | Admitting: Internal Medicine

## 2013-03-31 ENCOUNTER — Encounter (HOSPITAL_COMMUNITY)
Admission: RE | Admit: 2013-03-31 | Discharge: 2013-03-31 | Disposition: A | Discharge status: Home / Self Care | Payer: Medicare Other | Source: Ambulatory Visit | Attending: Internal Medicine | Admitting: Internal Medicine

## 2013-04-05 ENCOUNTER — Encounter (HOSPITAL_COMMUNITY)
Admission: RE | Admit: 2013-04-05 | Discharge: 2013-04-05 | Disposition: A | Discharge status: Home / Self Care | Payer: Medicare Other | Source: Ambulatory Visit | Attending: Internal Medicine | Admitting: Internal Medicine

## 2013-04-05 ENCOUNTER — Encounter (HOSPITAL_COMMUNITY): Payer: Self-pay

## 2013-04-05 NOTE — Progress Notes (Signed)
Julia Lucero was discharged from the pulmonary rehab program today.  Julia Lucero completed the education criteria and exercise sessions.  Her attendance was excellent and Julia Lucero was eager to exercise and practice what Julia Lucero learned in the program.

## 2013-04-05 NOTE — Progress Notes (Signed)
Patient completed a post walk test today for Pulmonary Rehab.  Patient walked 1000 feet on 12/16/12 in 6 minutes with zero breaks.  Patient walked 1,015 feet on 04/05/13 in 6 minutes with zero breaks.

## 2013-05-03 DIAGNOSIS — H25019 Cortical age-related cataract, unspecified eye: Secondary | ICD-10-CM | POA: Diagnosis not present

## 2013-05-03 DIAGNOSIS — H35039 Hypertensive retinopathy, unspecified eye: Secondary | ICD-10-CM | POA: Diagnosis not present

## 2013-05-03 DIAGNOSIS — H2510 Age-related nuclear cataract, unspecified eye: Secondary | ICD-10-CM | POA: Diagnosis not present

## 2013-05-09 ENCOUNTER — Telehealth: Payer: Self-pay | Admitting: Internal Medicine

## 2013-05-09 DIAGNOSIS — Z0120 Encounter for dental examination and cleaning without abnormal findings: Secondary | ICD-10-CM

## 2013-05-09 NOTE — Telephone Encounter (Signed)
Dr Cleotis Nipper on [address]. [facility]; you can look this up. He is my dentist.Can be expensive but I do not know other dentists personally. Socially I know Shelly Penumalli but I do not know her number. I spoke to him Dr Ninetta Lights and he is willing to see Ms Garbers. He is certified in sedation dentisty

## 2013-05-09 NOTE — Telephone Encounter (Signed)
I spoke with the pt and she states her dentist has retired and she wants to know if there is anyone that Dr. Marchelle Gearing knows that is familiar with people that use oxygen? Please advise. Carron Curie, CMA

## 2013-05-12 ENCOUNTER — Ambulatory Visit (INDEPENDENT_AMBULATORY_CARE_PROVIDER_SITE_OTHER): Payer: Medicare Other | Admitting: Cardiovascular Disease

## 2013-05-12 ENCOUNTER — Encounter: Payer: Self-pay | Admitting: Cardiovascular Disease

## 2013-05-12 VITALS — BP 110/50 | HR 68 | Ht 59.0 in | Wt 134.8 lb

## 2013-05-12 DIAGNOSIS — R0602 Shortness of breath: Secondary | ICD-10-CM | POA: Diagnosis not present

## 2013-05-12 DIAGNOSIS — I2789 Other specified pulmonary heart diseases: Secondary | ICD-10-CM | POA: Diagnosis not present

## 2013-05-12 NOTE — Assessment & Plan Note (Signed)
Oxygen dependent with history of restrictive lung disease followed by Dr. Marchelle Gearing.

## 2013-05-12 NOTE — Progress Notes (Signed)
05/12/2013 Julia Lucero   Apr 11, 1936  [id number]  Primary Physician ARONSON,RICHARD A, MD Primary Cardiologist: Runell Gess MD Julia Lucero   HPI:  The patient is a 77 year old, mildly overweight, married Caucasian female, mother of 2 who I last saw 6 months ago. I initially saw her April 2011 with new onset congestive heart failure with CT scan that showed cardiomegaly, interstitial edema and pleural effusions. This was preceded by an upper airway infection followed by a transcontinental flight. A 2D echo performed August 25, 2009, showed normal LV systolic function with a flattened septum consistent with RV pressure overload and mildly reduced systolic function with severe pulmonary hypertension. Her Myoview was negative. She was placed on low dose amlodipine and furosemide and her symptoms resolved. Dr. Marchelle Gearing thought that her symptoms were related to restrictive lung disease secondary to her remote rickets. She is on home O2 and has otherwise been stable since I last saw her. Followup echo performed May 11, 2012, was entirely normal without evidence of pulmonary hypertension. Since I saw her one year ago she's been relatively asymptomatic on home O2.    Current Outpatient Prescriptions  Medication Sig Dispense Refill  . amLODipine (NORVASC) 5 MG tablet TAKE 1 TABLET DAILY.  30 tablet  6  . aspirin 81 MG tablet Take 81 mg by mouth as needed.       . Calcium Citrate-Vitamin D (CITRACAL + D PO) Take 1 tablet by mouth daily.      . furosemide (LASIX) 40 MG tablet Take 20 mg by mouth daily.      . Multiple Vitamin (MULTIVITAMIN) capsule Take 1 capsule by mouth daily.        . OXYGEN-HELIUM IN Inhale 2 L into the lungs continuous.      . Oxygen Permeable Lens Products SOLN by Does not apply route.       No current facility-administered medications for this visit.    Allergies  Allergen Reactions  . Penicillins   . Sulfonamide Derivatives     History    Social History  . Marital Status: Married    Spouse Name: N/A    Number of Children: N/A  . Years of Education: N/A   Occupational History  . Not on file.   Social History Main Topics  . Smoking status: Never Smoker   . Smokeless tobacco: Never Used  . Alcohol Use: Not on file  . Drug Use: No  . Sexual Activity: Not on file   Other Topics Concern  . Not on file   Social History Narrative  . No narrative on file     Review of Systems: General: negative for chills, fever, night sweats or weight changes.  Cardiovascular: negative for chest pain, dyspnea on exertion, edema, orthopnea, palpitations, paroxysmal nocturnal dyspnea or shortness of breath Dermatological: negative for rash Respiratory: negative for cough or wheezing Urologic: negative for hematuria Abdominal: negative for nausea, vomiting, diarrhea, bright red blood per rectum, melena, or hematemesis Neurologic: negative for visual changes, syncope, or dizziness All other systems reviewed and are otherwise negative except as noted above.    Blood pressure 110/50, pulse 68, height 4\' 11"  (1.499 m), weight 134 lb 12.8 oz (61.145 kg).  General appearance: alert and no distress Neck: no adenopathy, no carotid bruit, no JVD, supple, symmetrical, trachea midline and thyroid not enlarged, symmetric, no tenderness/mass/nodules Lungs: clear to auscultation bilaterally Heart: regular rate and rhythm, S1, S2 normal, no murmur, click, rub or gallop Extremities: extremities normal,  atraumatic, no cyanosis or edema  EKG normal sinus rhythm at 68 without ST or T wave changes  ASSESSMENT AND PLAN:   PULMONARY HYPERTENSION Oxygen dependent with history of restrictive lung disease followed by Dr. Marchelle Gearing.      Runell Gess MD FACP,FACC,FAHA, [facility] 05/12/2013 5:18 PM

## 2013-05-12 NOTE — Patient Instructions (Signed)
Your physician wants you to follow-up in: 1 year with Dr Berry. You will receive a reminder letter in the mail two months in advance. If you don't receive a letter, please call our office to schedule the follow-up appointment.  

## 2013-05-12 NOTE — Telephone Encounter (Signed)
Pt advised and referall placed. Carron Curie, CMA

## 2013-05-13 ENCOUNTER — Encounter: Payer: Self-pay | Admitting: Cardiovascular Disease

## 2013-06-11 ENCOUNTER — Other Ambulatory Visit: Payer: Self-pay | Admitting: Cardiovascular Disease

## 2013-07-27 ENCOUNTER — Other Ambulatory Visit (HOSPITAL_COMMUNITY): Payer: Self-pay | Admitting: Cardiovascular Disease

## 2013-07-27 NOTE — Telephone Encounter (Signed)
Rx was sent to pharmacy electronically. 

## 2013-07-29 ENCOUNTER — Other Ambulatory Visit (HOSPITAL_COMMUNITY): Payer: Self-pay | Admitting: Cardiovascular Disease

## 2013-09-29 ENCOUNTER — Ambulatory Visit (INDEPENDENT_AMBULATORY_CARE_PROVIDER_SITE_OTHER): Payer: Medicare Other | Admitting: Internal Medicine

## 2013-09-29 ENCOUNTER — Encounter: Payer: Self-pay | Admitting: Internal Medicine

## 2013-09-29 VITALS — BP 126/58 | HR 80 | Ht 58.75 in | Wt 135.0 lb

## 2013-09-29 DIAGNOSIS — I2789 Other specified pulmonary heart diseases: Secondary | ICD-10-CM

## 2013-09-29 NOTE — Patient Instructions (Addendum)
#  Respiratory  - clinically you are stable with good oxygen levels for 200 feet of exertion  -  continue current care with oxygen - refer [place] [facility] for 2nd opinion  #Followup - 9 months or sooner if needed

## 2013-09-29 NOTE — Assessment & Plan Note (Signed)
#  Respiratory  - clinically you are stable with good oxygen levels for 200 feet of exertion  -  continue current care with oxygen - refer [place] [facility] for 2nd opinion  #Followup - 9 months or sooner if neede

## 2013-09-29 NOTE — Progress Notes (Signed)
Subjective:    Patient ID: Julia Lucero, female    DOB: [birth year], 78 y.o.   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 Decompensated Cor Pulmonale and discovery of pulmonary hypertension on ECHO (no right heart cath) following trip to [facility] in March 2011 due to pneumonia (CT chest  march 2011 at [place] consolidation and bialteral effusion). Negative Autoimmune Profile May 2011 except for mild raised CRP and borderline positive rheumatoid factor   March 04, 2010: feels well. Doing rehab. Not desaturating there. Due to fly to [facility] 10/20/2011on [employer]. Lot of questions on  flight o2. WE walked her today 185 feet x 3 laps and lowest o2 was only 92%. This is a huge improvement because in MAy 2011 she did desaturate with exertion. No new complaints. She continues on lasix and amlodipine started by Dr. Gwenlyn Found  in Apri 2011. Compliant with medicines. No interim problems. Denies edema, cough, orthopnea, paroxysmal nocturnal dyspnea, wheeze. She has seen Dr. Gwenlyn Found in interim and per his records he has resasured her and is following expectantly. REC: EXPECTANT FOLLOWUP  OV 11/01/2010: OVerall well. Has completed rehab. Underwent PT for muscle strengthening but now doing it on own. Unclear if has increasing dyspnea or not. AT times she denies it but at times she states she is marginally worse. Certainly seems to get tired by end of the day which might be baseline. Uses o2 wwiht exertion.  Continues lasix and amlodipine. Not seen Dr. Gwenlyn Found in interim either. No worsening edema. No wheeze. No chest pain. No syncope. No weight loss. No pnd. No orthopnea. Had 3 flight trips in interim and did well. Has repeat question on mechanism of restrictive chest disease and pulmonary hypertension. Walking desaturatin test today - and dropped to 88% - 87% after 2 laps  REC  Please continue daily exercise  - make sure heart rate is < 120-125 - you can take your heart rate that high but not higher  Make sure pulse ox is always >88% with or without oxygen  Use oxygen with exercise and walking and moving around  We will set up overnight oxygen study on room air to determine your oxygen need at night  Please see Dr. Gwenlyn Found as well  REturn to see me in 6 months  Come sooner if there are problems  OV 08/04/2011  OVerall well. Doing exercises 5-6 times per week in 2 sessions daily. Really helping. Overall stable. Class 2 dyspnea with exertion that is stable and relieved by rest. Uses o2 wwiht exertion.  Continues lasix and amlodipine. Not seen Dr. Gwenlyn Found in interim either. No worsening edema. No wheeze. No chest pain. No syncope. No weight loss. No pnd. No orthopnea. Had several flight trips in interim to [place] and did well; does DVT precaution exercises. Walking desaturatin test today - and dropped to 88% - 87% after 1 laps x 185 feet. The exertional desaturation is marginally worse than last OV. We will keep an eye on this because she is otherwise well  Past, Family, Social reviewed: no change since last visit   Please continue daily exercise - make sure heart rate is < 120-125 - you can take your heart rate that high but not higher  Make sure pulse ox is always >88% with or without oxygen  Use oxygen with exercise and walking and moving around  REturn to see me in 8 Months. Wil monitor her exertional desaturations  Come sooner if there are problems   OV 03/30/2012 Resp wise stable. Says she feels hollow inside. Unable to explain. Spiritually weary. No other iesues. Walking desaturation test on 03/30/2012 185 feet x 1 laps:  did desaturate. Rest pulse ox was 94%, final pulse ox was 88%. HR response 93/min at rest to 114/min at peak exertion at 1 laps. The distance to desaturation is similar to Feb 2013 but worse since May 2012  [facility] Ensure heart ok with Dr Gwenlyn Found   10/20/2012 Office Visit   followup  related restrictive cage defect related to rickets   - Last seen in December 2013. After that for my recommendation she did visit cardiology Dr. Gwenlyn Found. She and husband recollect having an echocardiogram that is reported as normal per her history. Overall she's stable although she feels that over the span of 2 years she's is a little bit more fatigued than baseline especially early in the morning. She's not sure if she's had her vitamin D and thyroid function tests checked. Also of note, she went to [place] by car and return a few weeks ago. She did take several breaks every few hours and stretch the legs. Upon return she had mild pedal edema that lasted 2 days and then resolved spontaneously. There was no warmth of the leg or hemoptysis or chest pain or undue dyspnea. It is noted that she does have bilateral varicose veins. No other health issues  - Walk test today 10/20/2012: 185 feet x3 laps on room air: Pulse oximetry 86% at the third lap with a heart rate of 107   Past, Family, Social reviewed: no change since last visit    #Respiratory  - clinically you are stable with good oxygen levels for 550 feet of exertion  - suspect some deconditioning over winter; so will re-refer to pulmonary rehabilitation - continue current care  #Fatigue - talk to ARONSON,RICHARD A, MD about checking vitamin D and thyroid levels  #Followup - 9 months or sooner if needed  OV 09/29/2013  Chief Complaint  Patient presents with  . Follow-up    pt c/o SOB with exertion.     followup  related restrictive cage defect related to rickets with associated pulmonary hypertension  - Last seen in May 2014. Since then overall stable. Last visit she only desaturated on room air after walking 185 feet x3 laps. However, she continues to use 2 L nasal cannula are 24 hours. She's feels that she needs this. But otherwise he does be that she's not any worse or any better. She does say that her travel plans are  unchanged but as she ages she finds that it's more complicated to organize do to dependency her oxygen. Is no worsening edema or chest pain or cough or sputum production.  - Walk test on room air her and made her feet x3 laps: At rest her heart rate was 76 per minute. Pulse ox is 96 a minute. After exertion 185 feet x1 lap she desaturated down to 87% and she stayed down at 87%. Peak heart rate was 101. Therefore exercise was terminated. This is similar to ? Worse than a year ago  - She is somewhat anxious about her long term future. We discussed [place] referral to pulm htn clinic and she is open to doing that  Social history: Husband suffered is herniation recently and is on conservative treatment  Review of Systems  Constitutional: Negative for fever and unexpected weight change.  HENT: Negative for congestion, dental problem,  ear pain, nosebleeds, postnasal drip, rhinorrhea, sinus pressure, sneezing, sore throat and trouble swallowing.   Eyes: Negative for redness and itching.  Respiratory: Negative for cough, chest tightness, shortness of breath and wheezing.   Cardiovascular: Negative for palpitations and leg swelling.  Gastrointestinal: Negative for nausea and vomiting.  Genitourinary: Negative for dysuria.  Musculoskeletal: Negative for joint swelling.  Skin: Negative for rash.  Neurological: Negative for headaches.  Hematological: Does not bruise/bleed easily.  Psychiatric/Behavioral: Negative for dysphoric mood. The patient is not nervous/anxious.        Objective:   Physical Exam  Vitals reviewed. Constitutional: She is oriented to person, place, and time. She appears well-developed and well-nourished. No distress.  Short   HENT:  Head: Normocephalic and atraumatic.  Right Ear: External ear normal.  Left Ear: External ear normal.  Mouth/Throat: Oropharynx is clear and moist. No oropharyngeal exudate.  Eyes: Conjunctivae and EOM are normal. Pupils are equal, round, and  reactive to light. Right eye exhibits no discharge. Left eye exhibits no discharge. No scleral icterus.  Neck: Normal range of motion. Neck supple. No JVD present. No tracheal deviation present. No thyromegaly present.  Cardiovascular: Normal rate, regular rhythm, normal heart sounds and intact distal pulses.  Exam reveals no gallop and no friction rub.   No murmur heard. Pulmonary/Chest: Effort normal and breath sounds normal. No respiratory distress. She has no wheezes. She has no rales. She exhibits no tenderness.  Kyphotic due to old rickets  Abdominal: Soft. Bowel sounds are normal. She exhibits no distension and no mass. There is no tenderness. There is no rebound and no guarding.  Musculoskeletal: Normal range of motion. She exhibits no edema and no tenderness.  Some deformities in joints due to old rickets  Lymphadenopathy:    She has no cervical adenopathy.  Neurological: She is alert and oriented to person, place, and time. She has normal reflexes. No cranial nerve deficit. She exhibits normal muscle tone. Coordination normal.  Skin: Skin is warm and dry. No rash noted. She is not diaphoretic. No erythema. No pallor.  Psychiatric: She has a normal mood and affect. Her behavior is normal. Judgment and thought content normal.        Assessment & Plan:

## 2013-11-07 DIAGNOSIS — I10 Essential (primary) hypertension: Secondary | ICD-10-CM | POA: Diagnosis not present

## 2013-11-07 DIAGNOSIS — Z79899 Other long term (current) drug therapy: Secondary | ICD-10-CM | POA: Diagnosis not present

## 2013-11-07 DIAGNOSIS — R0602 Shortness of breath: Secondary | ICD-10-CM | POA: Diagnosis not present

## 2013-11-07 DIAGNOSIS — R7981 Abnormal blood-gas level: Secondary | ICD-10-CM | POA: Diagnosis not present

## 2013-11-07 DIAGNOSIS — I2789 Other specified pulmonary heart diseases: Secondary | ICD-10-CM | POA: Diagnosis not present

## 2013-11-07 DIAGNOSIS — M47814 Spondylosis without myelopathy or radiculopathy, thoracic region: Secondary | ICD-10-CM | POA: Diagnosis not present

## 2013-11-07 DIAGNOSIS — I517 Cardiomegaly: Secondary | ICD-10-CM | POA: Diagnosis not present

## 2013-11-08 ENCOUNTER — Telehealth: Payer: Self-pay | Admitting: Internal Medicine

## 2013-11-08 NOTE — Telephone Encounter (Signed)
I called spoke with pt. She reports she was seen yesterday by [facility] for her 2nd opinion. FYI for MR.  Pt records can be pulled in care everywhere.

## 2013-11-14 DIAGNOSIS — Z01810 Encounter for preprocedural cardiovascular examination: Secondary | ICD-10-CM | POA: Diagnosis not present

## 2013-11-14 DIAGNOSIS — I2789 Other specified pulmonary heart diseases: Secondary | ICD-10-CM | POA: Diagnosis not present

## 2013-12-02 DIAGNOSIS — L739 Follicular disorder, unspecified: Secondary | ICD-10-CM | POA: Diagnosis not present

## 2013-12-02 DIAGNOSIS — D239 Other benign neoplasm of skin, unspecified: Secondary | ICD-10-CM | POA: Diagnosis not present

## 2013-12-02 DIAGNOSIS — L821 Other seborrheic keratosis: Secondary | ICD-10-CM | POA: Diagnosis not present

## 2013-12-02 DIAGNOSIS — L909 Atrophic disorder of skin, unspecified: Secondary | ICD-10-CM | POA: Diagnosis not present

## 2013-12-02 DIAGNOSIS — D1801 Hemangioma of skin and subcutaneous tissue: Secondary | ICD-10-CM | POA: Diagnosis not present

## 2013-12-02 DIAGNOSIS — L919 Hypertrophic disorder of the skin, unspecified: Secondary | ICD-10-CM | POA: Diagnosis not present

## 2014-01-11 ENCOUNTER — Telehealth: Payer: Self-pay | Admitting: Internal Medicine

## 2014-01-11 NOTE — Telephone Encounter (Signed)
Received forms. Placed in MR's look ats to be signed on Friday.

## 2014-01-12 NOTE — Telephone Encounter (Signed)
Form signed. Placed up front for pick up.  LMTCB for pt

## 2014-01-12 NOTE — Telephone Encounter (Signed)
Pt returned call.  Advised pt form has been signed & placed up front for her to pick up.  Pt verbalized understanding & states nothing further needed at this time.  Satira Anis

## 2014-03-01 ENCOUNTER — Telehealth: Payer: Self-pay | Admitting: Internal Medicine

## 2014-03-01 NOTE — Telephone Encounter (Signed)
I called spoke with pt. She is going to bring her form by for MR to sign for her to be able to fly with her O2. She is bring form on Monday for MR.  FYI for elise to look out for form.

## 2014-03-06 ENCOUNTER — Telehealth: Payer: Self-pay | Admitting: Internal Medicine

## 2014-03-06 NOTE — Telephone Encounter (Signed)
Received form. Will have MR sign when back in office on 03/07/14.

## 2014-03-08 NOTE — Telephone Encounter (Signed)
Called and spoke to pt. Informed pt the form has been completed and will be up front for pick up. Pt verbalized understanding and denied any further questions or concerns at this time. Nothing further needed.

## 2014-03-08 NOTE — Telephone Encounter (Signed)
See phone note from 03/01/14.  Called and spoke to pt. Informed pt the form has been completed and will be up front for pick up. Pt verbalized understanding and denied any further questions or concerns at this time. Nothing further needed.

## 2014-04-18 ENCOUNTER — Telehealth: Payer: Self-pay | Admitting: Internal Medicine

## 2014-04-18 NOTE — Telephone Encounter (Signed)
Pt states for past 3-4 days she has felt a sensation of something brushing up against her lower left forearm .  This occurs in the morning and only lasts about 3-5 minutes.  Denies pain, numbness, tingling or loss of use in arm.  Please advise.

## 2014-04-18 NOTE — Telephone Encounter (Signed)
i called spoke with pt. Aware of recs. Nothing further needed

## 2014-04-18 NOTE — Telephone Encounter (Signed)
I  Have no idea what that could be and how it could be related to lung. REfer to Geoffery Lyons, MD

## 2014-04-26 ENCOUNTER — Ambulatory Visit: Payer: Medicare Other

## 2014-04-26 ENCOUNTER — Ambulatory Visit (INDEPENDENT_AMBULATORY_CARE_PROVIDER_SITE_OTHER): Payer: Medicare Other

## 2014-04-26 DIAGNOSIS — Z23 Encounter for immunization: Secondary | ICD-10-CM | POA: Diagnosis not present

## 2014-04-28 ENCOUNTER — Other Ambulatory Visit: Payer: Self-pay | Admitting: Cardiovascular Disease

## 2014-04-28 NOTE — Telephone Encounter (Signed)
Rx was sent to pharmacy electronically. OV 12/7

## 2014-05-15 ENCOUNTER — Encounter: Payer: Self-pay | Admitting: Cardiovascular Disease

## 2014-05-15 ENCOUNTER — Ambulatory Visit (INDEPENDENT_AMBULATORY_CARE_PROVIDER_SITE_OTHER): Payer: Medicare Other | Admitting: Cardiovascular Disease

## 2014-05-15 VITALS — BP 140/60 | HR 84 | Ht 59.0 in | Wt 132.1 lb

## 2014-05-15 DIAGNOSIS — Z8639 Personal history of other endocrine, nutritional and metabolic disease: Secondary | ICD-10-CM

## 2014-05-15 DIAGNOSIS — R0602 Shortness of breath: Secondary | ICD-10-CM

## 2014-05-15 NOTE — Progress Notes (Signed)
05/15/2014 Julia Lucero   05-23-1936  [id number]  Primary Physician ARONSON,RICHARD A, MD Primary Cardiologist: Lorretta Harp MD [name]   HPI:  The patient is a 78 -year-old, mildly overweight, married Caucasian female, mother of 2 who I last saw 12 months ago. I initially saw her April 2011 with new onset congestive heart failure with CT scan that showed cardiomegaly, interstitial edema and pleural effusions. This was preceded by an upper airway infection followed by a transcontinental flight. A 2D echo performed August 25, 2009, showed normal LV systolic function with a flattened septum consistent with RV pressure overload and mildly reduced systolic function with severe pulmonary hypertension. Her Myoview was negative. She was placed on low dose amlodipine and furosemide and her symptoms resolved. Dr. Chase Lucero thought that her symptoms were related to restrictive lung disease secondary to her remote rickets. She is on home O2 and has otherwise been stable since I last saw her. Followup echo performed May 11, 2012, was entirely normal without evidence of pulmonary hypertension. Since I saw her one year ago she's been relatively asymptomatic on home O2. She has been following with Dr. Chase Lucero as well as obtaining a second opinion at [facility] thought that she did not have primary hypertension but rather restrictive lung disease secondary to her right hip as we had initially suggested.   Current Outpatient Prescriptions  Medication Sig Dispense Refill  . amLODipine (NORVASC) 5 MG tablet TAKE 1 TABLET DAILY. 30 tablet 10  . aspirin 81 MG tablet Take 81 mg by mouth as needed.     . furosemide (LASIX) 40 MG tablet Take 0.5 tablets (20 mg total) by mouth daily. 30 tablet 0  . Multiple Vitamin (MULTIVITAMIN) capsule Take 1 capsule by mouth daily.      . OXYGEN-HELIUM IN Inhale 2 L into the lungs continuous.     No current facility-administered  medications for this visit.    Allergies  Allergen Reactions  . Penicillins   . Sulfonamide Derivatives   . Amoxicillin Rash    History   Social History  . Marital Status: Married    Spouse Name: N/A    Number of Children: N/A  . Years of Education: N/A   Occupational History  . Not on file.   Social History Main Topics  . Smoking status: Never Smoker   . Smokeless tobacco: Never Used  . Alcohol Use: Not on file  . Drug Use: No  . Sexual Activity: Not on file   Other Topics Concern  . Not on file   Social History Narrative     Review of Systems: General: negative for chills, fever, night sweats or weight changes.  Cardiovascular: negative for chest pain, dyspnea on exertion, edema, orthopnea, palpitations, paroxysmal nocturnal dyspnea or shortness of breath Dermatological: negative for rash Respiratory: negative for cough or wheezing Urologic: negative for hematuria Abdominal: negative for nausea, vomiting, diarrhea, bright red blood per rectum, melena, or hematemesis Neurologic: negative for visual changes, syncope, or dizziness All other systems reviewed and are otherwise negative except as noted above.    Blood pressure 140/60, pulse 84, height 4\' 11"  (1.499 m), weight 132 lb 1.6 oz (59.92 kg).  General appearance: alert and no distress Neck: no adenopathy, no carotid bruit, no JVD, supple, symmetrical, trachea midline and thyroid not enlarged, symmetric, no tenderness/mass/nodules Lungs: clear to auscultation bilaterally Heart: regular rate and rhythm, S1, S2 normal, no murmur, click, rub or gallop Extremities: extremities normal, atraumatic,  no cyanosis or edema  EKG normal sinus rhythm at 84 with biatrial enlargement. I personally reviewed this EKG  ASSESSMENT AND PLAN:   PULMONARY HYPERTENSION History of pulmonary hypertension after a transcondylar flight however her last 2-D echocardiogram performed 05/11/12 revealed normal LV and RV size and function,  normal valvular function and normal right heart pressures. She did see Dr. Chase Lucero as well as obtain a second opinion at [facility] who thought that some of her shortness of breath was related to her rickets and restrictive lung disease. She is on chronic home oxygen.      Lorretta Harp MD FACP,FACC,FAHA, [facility] 05/15/2014 3:26 PM

## 2014-05-15 NOTE — Assessment & Plan Note (Signed)
History of pulmonary hypertension after a transcondylar flight however her last 2-D echocardiogram performed 05/11/12 revealed normal LV and RV size and function, normal valvular function and normal right heart pressures. She did see Dr. Chase Caller as well as obtain a second opinion at [facility] who thought that some of her shortness of breath was related to her rickets and restrictive lung disease. She is on chronic home oxygen.

## 2014-05-15 NOTE — Patient Instructions (Signed)
Your physician wants you to follow-up in 1 year with Dr. Berry. You will receive a reminder letter in the mail 2 months in advance. If you do not receive a letter, please call our office to schedule the follow-up appointment.  

## 2014-06-06 ENCOUNTER — Other Ambulatory Visit: Payer: Self-pay | Admitting: Cardiovascular Disease

## 2014-06-06 NOTE — Telephone Encounter (Signed)
Rx(s) sent to pharmacy electronically.  

## 2014-06-19 ENCOUNTER — Telehealth: Payer: Self-pay | Admitting: Cardiovascular Disease

## 2014-06-19 NOTE — Telephone Encounter (Signed)
Pt called in stating that since she renewed her prescription for Furosemide it does not seem to be working well. She states that she retaining fluid, it has not become major as of yet but she is noticing the change. Please call  Thanks

## 2014-06-19 NOTE — Telephone Encounter (Signed)
Spoke with pt, she has edema in her feet and ankles where she usually has none. She denies SOB, has not weighed. She reports the extra fluid is uncomfortable. Patient told to take an extra 1/2 tablet today and for the next 2 to 3 days as needed. Patient voiced understanding of these instructions and she will call back if she cont to have problems.

## 2014-06-22 ENCOUNTER — Encounter: Payer: Self-pay | Admitting: Cardiovascular Disease

## 2014-06-22 ENCOUNTER — Ambulatory Visit (INDEPENDENT_AMBULATORY_CARE_PROVIDER_SITE_OTHER): Payer: Medicare Other | Admitting: Cardiovascular Disease

## 2014-06-22 VITALS — BP 122/80 | HR 85 | Ht 59.0 in | Wt 132.7 lb

## 2014-06-22 DIAGNOSIS — R0602 Shortness of breath: Secondary | ICD-10-CM | POA: Diagnosis not present

## 2014-06-22 NOTE — Assessment & Plan Note (Signed)
Julia Lucero returns today a month after being seen with some complaints of "puffiness. She does have her strict of lung disease. She is on home O2. She is also on a diuretic. Her weight is unchanged. She has no peripheral edema. Her lungs are clear and she has no jugular venous distention. There is no objective evidence that she is volume overloaded today. I will have her see a mid-level provider back in 3 months.

## 2014-06-22 NOTE — Patient Instructions (Signed)
We request that you follow-up in: 3 months with an extender and in 6 months with Dr Berry  You will receive a reminder letter in the mail two months in advance. If you don't receive a letter, please call our office to schedule the follow-up appointment.   

## 2014-06-22 NOTE — Progress Notes (Signed)
06/22/2014 Romeo Rabon   [birth year]  [id number]  Primary Physician ARONSON,RICHARD A, MD Primary Cardiologist: Lorretta Harp MD [name]   HPI:  The patient is a 79 -year-old, mildly overweight, married Caucasian female, mother of 2 who I last saw 1 month ago. I initially saw her April 2011 with new onset congestive heart failure with CT scan that showed cardiomegaly, interstitial edema and pleural effusions. This was preceded by an upper airway infection followed by a transcontinental flight. A 2D echo performed August 25, 2009, showed normal LV systolic function with a flattened septum consistent with RV pressure overload and mildly reduced systolic function with severe pulmonary hypertension. Her Myoview was negative. She was placed on low dose amlodipine and furosemide and her symptoms resolved. Dr. Chase Caller thought that her symptoms were related to restrictive lung disease secondary to her remote rickets. She is on home O2 and has otherwise been stable since I last saw her. Followup echo performed May 11, 2012, was entirely normal without evidence of pulmonary hypertension. Since I saw her one year ago she's been relatively asymptomatic on home O2. She has been following with Dr. Chase Caller as well as obtaining a second opinion at [facility] thought that she did not have primary hypertension but rather restrictive lung disease secondary to her rickets. Since I saw her mother ago she says that she feels puffy. She denies increasing shortness of breath. Her weight is unchanged. Exam is otherwise benign.   Current Outpatient Prescriptions  Medication Sig Dispense Refill  . amLODipine (NORVASC) 5 MG tablet TAKE 1 TABLET DAILY. 30 tablet 10  . aspirin 81 MG tablet Take 81 mg by mouth as needed.     . furosemide (LASIX) 40 MG tablet Take 0.5 tablets (20 mg total) by mouth daily. As Directed. 30 tablet 5  . Multiple Vitamin (MULTIVITAMIN) capsule Take 1  capsule by mouth daily.      . OXYGEN-HELIUM IN Inhale 2 L into the lungs continuous.     No current facility-administered medications for this visit.    Allergies  Allergen Reactions  . Penicillins   . Sulfonamide Derivatives   . Amoxicillin Rash    History   Social History  . Marital Status: Married    Spouse Name: N/A    Number of Children: N/A  . Years of Education: N/A   Occupational History  . Not on file.   Social History Main Topics  . Smoking status: Never Smoker   . Smokeless tobacco: Never Used  . Alcohol Use: Not on file  . Drug Use: No  . Sexual Activity: Not on file   Other Topics Concern  . Not on file   Social History Narrative     Review of Systems: General: negative for chills, fever, night sweats or weight changes.  Cardiovascular: negative for chest pain, dyspnea on exertion, edema, orthopnea, palpitations, paroxysmal nocturnal dyspnea or shortness of breath Dermatological: negative for rash Respiratory: negative for cough or wheezing Urologic: negative for hematuria Abdominal: negative for nausea, vomiting, diarrhea, bright red blood per rectum, melena, or hematemesis Neurologic: negative for visual changes, syncope, or dizziness All other systems reviewed and are otherwise negative except as noted above.    Blood pressure 122/80, pulse 85, height 4\' 11"  (1.499 m), weight 132 lb 11.2 oz (60.192 kg).  General appearance: alert and no distress Neck: no adenopathy, no carotid bruit, no JVD, supple, symmetrical, trachea midline and thyroid not enlarged, symmetric, no tenderness/mass/nodules Lungs:  clear to auscultation bilaterally Heart: regular rate and rhythm, S1, S2 normal, no murmur, click, rub or gallop Extremities: extremities normal, atraumatic, no cyanosis or edema  EKG normal sinus rhythm at 85 biatrial enlargement unchanged from prior EKGs. I personally reviewed this EKG  ASSESSMENT AND PLAN:   SHORTNESS OF BREATH (SOB) Miss  Cowens returns today a month after being seen with some complaints of "puffiness. She does have her strict of lung disease. She is on home O2. She is also on a diuretic. Her weight is unchanged. She has no peripheral edema. Her lungs are clear and she has no jugular venous distention. There is no objective evidence that she is volume overloaded today. I will have her see a mid-level provider back in 3 months.       Lorretta Harp MD FACP,FACC,FAHA, [facility] 06/22/2014 4:48 PM

## 2014-07-25 ENCOUNTER — Telehealth: Payer: Self-pay | Admitting: Internal Medicine

## 2014-07-25 NOTE — Telephone Encounter (Signed)
Made in error

## 2014-08-02 ENCOUNTER — Telehealth: Payer: Self-pay | Admitting: Internal Medicine

## 2014-08-03 NOTE — Telephone Encounter (Signed)
lmtcb for pt. Flight letter has been completed, pt will need to pick up or we can mail letter to her.

## 2014-08-04 NOTE — Telephone Encounter (Signed)
Spoke with pt, she will have her husband pick these up today.  These have been placed up front.  Nothing further needed.

## 2014-08-05 ENCOUNTER — Other Ambulatory Visit: Payer: Self-pay | Admitting: Cardiovascular Disease

## 2014-08-07 NOTE — Telephone Encounter (Signed)
Rx(s) sent to pharmacy electronically.  

## 2014-08-19 ENCOUNTER — Other Ambulatory Visit (HOSPITAL_COMMUNITY): Payer: Self-pay | Admitting: Cardiovascular Disease

## 2014-08-21 NOTE — Telephone Encounter (Signed)
Rx has been sent to the pharmacy electronically. ° °

## 2014-09-04 DIAGNOSIS — J019 Acute sinusitis, unspecified: Secondary | ICD-10-CM | POA: Diagnosis not present

## 2014-09-08 ENCOUNTER — Encounter: Payer: Self-pay | Admitting: Internal Medicine

## 2014-09-08 ENCOUNTER — Ambulatory Visit (INDEPENDENT_AMBULATORY_CARE_PROVIDER_SITE_OTHER): Payer: Medicare Other | Admitting: Internal Medicine

## 2014-09-08 VITALS — BP 142/58 | HR 90 | Ht 59.0 in | Wt 132.6 lb

## 2014-09-08 DIAGNOSIS — R0989 Other specified symptoms and signs involving the circulatory and respiratory systems: Secondary | ICD-10-CM | POA: Diagnosis not present

## 2014-09-08 DIAGNOSIS — R0689 Other abnormalities of breathing: Secondary | ICD-10-CM | POA: Diagnosis not present

## 2014-09-08 DIAGNOSIS — R0600 Dyspnea, unspecified: Secondary | ICD-10-CM

## 2014-09-08 DIAGNOSIS — I2781 Cor pulmonale (chronic): Secondary | ICD-10-CM | POA: Diagnosis not present

## 2014-09-08 NOTE — Patient Instructions (Addendum)
ICD-9-CM ICD-10-CM   1. Dyspnea and respiratory abnormality 786.09 R06.00     R06.89   2. Cor pulmonale, chronic 416.9 I27.81   3. Bibasilar crackles 786.7 R09.89    OVerall stable Continue o2 with exertion and night and physical fitness THere appears to be new crackles on exam of lung   - do HRCT chest ; will cal with results  Followup  1 year unless CT determines I see you earlier

## 2014-09-08 NOTE — Progress Notes (Signed)
Subjective:    Patient ID: Julia Lucero, female    DOB: Oct 03, 1935, 79 y.o.   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 Decompensated Cor Pulmonale and discovery of pulmonary hypertension on ECHO (no right heart cath) following trip to [facility] in March 2011 due to pneumonia (CT chest  march 2011 at [place] consolidation and bialteral effusion). Negative Autoimmune Profile May 2011 except for mild raised CRP and borderline positive rheumatoid factor   March 04, 2010: feels well. Doing rehab. Not desaturating there. Due to fly to [facility] 10/20/2011on [employer]. Lot of questions on  flight o2. WE walked her today 185 feet x 3 laps and lowest o2 was only 92%. This is a huge improvement because in MAy 2011 she did desaturate with exertion. No new complaints. She continues on lasix and amlodipine started by Dr. Gwenlyn Found  in Apri 2011. Compliant with medicines. No interim problems. Denies edema, cough, orthopnea, paroxysmal nocturnal dyspnea, wheeze. She has seen Dr. Gwenlyn Found in interim and per his records he has resasured her and is following expectantly. REC: EXPECTANT FOLLOWUP  OV 11/01/2010: OVerall well. Has completed rehab. Underwent PT for muscle strengthening but now doing it on own. Unclear if has increasing dyspnea or not. AT times she denies it but at times she states she is marginally worse. Certainly seems to get tired by end of the day which might be baseline. Uses o2 wwiht exertion.  Continues lasix and amlodipine. Not seen Dr. Gwenlyn Found in interim either. No worsening edema. No wheeze. No chest pain. No syncope. No weight loss. No pnd. No orthopnea. Had 3 flight trips in interim and did well. Has repeat question on mechanism of restrictive chest disease and pulmonary hypertension. Walking desaturatin test today - and dropped to 88% - 87% after 2 laps  REC  Please continue daily exercise - make  sure heart rate is < 120-125 - you can take your heart rate that high but not higher  Make sure pulse ox is always >88% with or without oxygen  Use oxygen with exercise and walking and moving around  We will set up overnight oxygen study on room air to determine your oxygen need at night  Please see Dr. Gwenlyn Found as well  REturn to see me in 6 months  Come sooner if there are problems  OV 08/04/2011  OVerall well. Doing exercises 5-6 times per week in 2 sessions daily. Really helping. Overall stable. Class 2 dyspnea with exertion that is stable and relieved by rest. Uses o2 wwiht exertion.  Continues lasix and amlodipine. Not seen Dr. Gwenlyn Found in interim either. No worsening edema. No wheeze. No chest pain. No syncope. No weight loss. No pnd. No orthopnea. Had several flight trips in interim to [place] and did well; does DVT precaution exercises. Walking desaturatin test today - and dropped to 88% - 87% after 1 laps x 185 feet. The exertional desaturation is marginally worse than last OV. We will keep an eye on this because she is otherwise well  Past, Family, Social reviewed: no change since last visit   Please continue daily exercise - make sure heart rate is < 120-125 - you can take your heart rate that high but not higher  Make sure pulse ox is always >88% with or without oxygen  Use oxygen with exercise and walking and moving around  REturn to see me in 8 Months. Wil monitor her exertional desaturations  Come  sooner if there are problems   OV 03/30/2012 Resp wise stable. Says she feels hollow inside. Unable to explain. Spiritually weary. No other iesues. Walking desaturation test on 03/30/2012 185 feet x 1 laps:  did desaturate. Rest pulse ox was 94%, final pulse ox was 88%. HR response 93/min at rest to 114/min at peak exertion at 1 laps. The distance to desaturation is similar to Feb 2013 but worse since May 2012  [facility] Ensure heart ok with Dr Gwenlyn Found   10/20/2012 Office Visit  followup   related restrictive cage defect related to rickets   - Last seen in December 2013. After that for my recommendation she did visit cardiology Dr. Gwenlyn Found. She and husband recollect having an echocardiogram that is reported as normal per her history. Overall she's stable although she feels that over the span of 2 years she's is a little bit more fatigued than baseline especially early in the morning. She's not sure if she's had her vitamin D and thyroid function tests checked. Also of note, she went to [place] by car and return a few weeks ago. She did take several breaks every few hours and stretch the legs. Upon return she had mild pedal edema that lasted 2 days and then resolved spontaneously. There was no warmth of the leg or hemoptysis or chest pain or undue dyspnea. It is noted that she does have bilateral varicose veins. No other health issues  - Walk test today 10/20/2012: 185 feet x3 laps on room air: Pulse oximetry 86% at the third lap with a heart rate of 107   Past, Family, Social reviewed: no change since last visit    #Respiratory  - clinically you are stable with good oxygen levels for 550 feet of exertion  - suspect some deconditioning over winter; so will re-refer to pulmonary rehabilitation - continue current care  #Fatigue - talk to ARONSON,RICHARD A, MD about checking vitamin D and thyroid levels  #Followup - 9 months or sooner if needed  OV 09/29/2013  Chief Complaint  Patient presents with  . Follow-up    pt c/o SOB with exertion.     followup  related restrictive cage defect related to rickets with associated pulmonary hypertension  - Last seen in May 2014. Since then overall stable. Last visit she only desaturated on room air after walking 185 feet x3 laps. However, she continues to use 2 L nasal cannula are 24 hours. She's feels that she needs this. But otherwise he does be that she's not any worse or any better. She does say that her travel plans are unchanged but  as she ages she finds that it's more complicated to organize do to dependency her oxygen. Is no worsening edema or chest pain or cough or sputum production.  - Walk test on room air her and made her feet x3 laps: At rest her heart rate was 76 per minute. Pulse ox is 96 a minute. After exertion 185 feet x1 lap she desaturated down to 87% and she stayed down at 87%. Peak heart rate was 101. Therefore exercise was terminated. This is similar to ? Worse than a year ago  - She is somewhat anxious about her long term future. We discussed [facility] referral to pulm htn clinic and she is open to doing that  Social history: Husband suffered is herniation recently and is on conservative treatment     OV 09/08/2014  Chief Complaint  Patient presents with  . Follow-up    Pt stated  she was feeling well until catching cold. Pt c/o sinus congestion and cough. Pt currently taking levaquin. Pt c/o dry cough. Pt denies increase in SOB and CP/tightness.     Annual follow-up childhood rickets associated with thoracic cage defect associated with clinical pulmonary hypertension /cor pulmolane based on echo. Clinically she suffers from exertional hypoxemia.   I have been following Julia Lucero for many years. The last 1 year she reports has been stable. In the interim in summer 2015 she did go to [employer] pulmonary hypertension program. According to her history she was reassured that problems are related to thoracic cage defect and she was discharged from follow-up She does not recollect having had a right heart catheterization though chart review at [facility] suggest that she had it but I'm unable to access these results. She did have a VQ scan which was determined is a low probability for pulmonary embolism. She did have a chest x-ray that showed enlarged cardiac silhouette along with pectus excavatum. And also thoracic scoliosis. Currently overall she is doing well but a week ago she did pick up a cold and she is on a  ten-day Levaquin. Today Walking desaturation test 185 feet 3 laps on room air: She walked all 3 laps and at the very end dropped to 88%. This is probably better than a year ago   of note, exam shows bibasal crackles which I believe might be new for her   Review of Systems  Constitutional: Negative for fever and unexpected weight change.  HENT: Positive for congestion. Negative for dental problem, ear pain, nosebleeds, postnasal drip, rhinorrhea, sinus pressure, sneezing, sore throat and trouble swallowing.   Eyes: Negative for redness and itching.  Respiratory: Positive for cough. Negative for chest tightness, shortness of breath and wheezing.   Cardiovascular: Negative for palpitations and leg swelling.  Gastrointestinal: Negative for nausea and vomiting.  Genitourinary: Negative for dysuria.  Musculoskeletal: Negative for joint swelling.  Skin: Negative for rash.  Neurological: Negative for headaches.  Hematological: Does not bruise/bleed easily.  Psychiatric/Behavioral: Negative for dysphoric mood. The patient is not nervous/anxious.     Current outpatient prescriptions:  .  amLODipine (NORVASC) 5 MG tablet, TAKE 1 TABLET DAILY., Disp: 30 tablet, Rfl: 10 .  aspirin 81 MG tablet, Take 81 mg by mouth as needed. , Disp: , Rfl:  .  furosemide (LASIX) 40 MG tablet, Take 0.5 tablet (20 mg total) by mouth daily as directed., Disp: 15 tablet, Rfl: 11 .  levofloxacin (LEVAQUIN) 500 MG tablet, Take 500 mg by mouth daily., Disp: , Rfl:  .  Multiple Vitamin (MULTIVITAMIN) capsule, Take 1 capsule by mouth daily.  , Disp: , Rfl:  .  OXYGEN-HELIUM IN, Inhale 2 L into the lungs continuous., Disp: , Rfl:       Objective:   Physical Exam  Filed Vitals:   09/08/14 1426  BP: 142/58  Pulse: 90  Height: 4\' 11"  (1.499 m)  Weight: 132 lb 9.6 oz (60.147 kg)  SpO2: 95%     Vitals reviewed. Constitutional: She is oriented to person, place, and time. She appears well-developed and well-nourished.  No distress.  Short   HENT:  Head: Normocephalic and atraumatic.  Right Ear: External ear normal.  Left Ear: External ear normal.  Mouth/Throat: Oropharynx is clear and moist. No oropharyngeal exudate.  Eyes: Conjunctivae and EOM are normal. Pupils are equal, round, and reactive to light. Right eye exhibits no discharge. Left eye exhibits no discharge. No scleral icterus.  Neck:  Normal range of motion. Neck supple. No JVD present. No tracheal deviation present. No thyromegaly present.  Cardiovascular: Normal rate, regular rhythm, normal heart sounds and intact distal pulses.  Exam reveals no gallop and no friction rub.   No murmur heard. Pulmonary/Chest: Effort normal and breath sounds normal. No respiratory distress. She has no wheezes. She has NEW CRACKL:ES AT BASEales. She exhibits no tenderness.  Kyphotic due to old rickets  Abdominal: Soft. Bowel sounds are normal. She exhibits no distension and no mass. There is no tenderness. There is no rebound and no guarding.  Musculoskeletal: Normal range of motion. She exhibits no edema and no tenderness.  Some deformities in joints due to old rickets  Lymphadenopathy:    She has no cervical adenopathy.  Neurological: She is alert and oriented to person, place, and time. She has normal reflexes. No cranial nerve deficit. She exhibits normal muscle tone. Coordination normal.  Skin: Skin is warm and dry. No rash noted. She is not diaphoretic. No erythema. No pallor.  Psychiatric: She has a normal mood and affect. Her behavior is normal. Judgment and thought content normal.            Assessment & Plan:     ICD-9-CM ICD-10-CM   1. Dyspnea and respiratory abnormality 786.09 R06.00     R06.89   2. Cor pulmonale, chronic 416.9 I27.81   3. Bibasilar crackles 786.7 R09.89    OVerall stable Continue o2 with exertion and night and physical fitness THere appears to be new crackles on exam of lung - need to rule out ILD because on exam she is  euvolemic   - do HRCT chest ; will cal with results  Followup  1 year unless CT determines I see you earlier '''  Dr. Brand Males, M.D., [facility].C.P Pulmonary and Critical Care Medicine Staff Physician [facility] Pager: [phone], If no answer or between  15:00h - 7:00h: call [phone]  09/09/2014 11:17 AM

## 2014-09-12 ENCOUNTER — Ambulatory Visit (INDEPENDENT_AMBULATORY_CARE_PROVIDER_SITE_OTHER)
Admission: RE | Admit: 2014-09-12 | Discharge: 2014-09-12 | Disposition: A | Discharge status: Home / Self Care | Payer: Medicare Other | Source: Ambulatory Visit | Attending: Internal Medicine | Admitting: Internal Medicine

## 2014-09-12 DIAGNOSIS — R0689 Other abnormalities of breathing: Secondary | ICD-10-CM

## 2014-09-12 DIAGNOSIS — I2510 Atherosclerotic heart disease of native coronary artery without angina pectoris: Secondary | ICD-10-CM | POA: Diagnosis not present

## 2014-09-12 DIAGNOSIS — R0989 Other specified symptoms and signs involving the circulatory and respiratory systems: Secondary | ICD-10-CM | POA: Diagnosis not present

## 2014-09-12 DIAGNOSIS — R0600 Dyspnea, unspecified: Secondary | ICD-10-CM

## 2014-09-12 DIAGNOSIS — J849 Interstitial pulmonary disease, unspecified: Secondary | ICD-10-CM | POA: Diagnosis not present

## 2014-09-18 NOTE — Progress Notes (Signed)
Quick Note:  Called and spoke to pt. Informed pt of the results per MR. Pt verbalized understanding and denied any further questions or concerns at this time. ______ 

## 2014-12-29 DIAGNOSIS — S233XXA Sprain of ligaments of thoracic spine, initial encounter: Secondary | ICD-10-CM | POA: Diagnosis not present

## 2014-12-29 DIAGNOSIS — M62830 Muscle spasm of back: Secondary | ICD-10-CM | POA: Diagnosis not present

## 2015-01-03 ENCOUNTER — Telehealth: Payer: Self-pay | Admitting: Internal Medicine

## 2015-01-03 NOTE — Telephone Encounter (Signed)
Spoke with patient, states that she needs a Physician Statement filled out ( we have done this is in the past) for her to carry POC on airplane. Pt will be flying out to [place] on 01/19/15 and will be flying home to [facility] 02/06/15 Pt to come pick up once completed.  Form given to Dr Chase Caller to complete.

## 2015-01-03 NOTE — Telephone Encounter (Signed)
Spoke with pt husband- form completed and placed up front to be picked up.  Nothing further needed.

## 2015-01-03 NOTE — Telephone Encounter (Signed)
Form was given to Julia Lucero to have you fill out -- sorry for the delay in getting it to [place] before message was sent.  You have already signed it and Julia Lucero has brought it to Triage to contact patient of completion.  Nothing further needed.

## 2015-01-03 NOTE — Telephone Encounter (Signed)
wher is form? Please have Julia Lucero fill out based on pripor forms. I want to sign it 01/03/2015 or 01/04/15  because then I am on vacation again

## 2015-01-03 NOTE — Telephone Encounter (Signed)
Patient aware that form is ready for pick up.  Patient asked if the dates of her flight was on the form-- I advised her that there was nowhere to put this information on the form. Pt husband got on the phone and was stating that there should be a placed to put the dates of travel and flight number, I advised again that there was nowhere to put this and he asked if I had the correct form. When I stated that form filled out was [employer], patient husband stated this was the wrong form. Patient is [place], form I have is for [employer]. Advised that we would print the correct form and call her once complete.  Please advise, form given to Daneil Dan to fill out and have Dr Chase Caller sign. Thanks.

## 2015-01-12 DIAGNOSIS — B372 Candidiasis of skin and nail: Secondary | ICD-10-CM | POA: Diagnosis not present

## 2015-01-12 DIAGNOSIS — R21 Rash and other nonspecific skin eruption: Secondary | ICD-10-CM | POA: Diagnosis not present

## 2015-01-18 ENCOUNTER — Encounter: Payer: Self-pay | Admitting: Internal Medicine

## 2015-01-18 ENCOUNTER — Ambulatory Visit (INDEPENDENT_AMBULATORY_CARE_PROVIDER_SITE_OTHER): Payer: Medicare Other | Admitting: Internal Medicine

## 2015-01-18 ENCOUNTER — Ambulatory Visit (INDEPENDENT_AMBULATORY_CARE_PROVIDER_SITE_OTHER)
Admission: RE | Admit: 2015-01-18 | Discharge: 2015-01-18 | Disposition: A | Discharge status: Home / Self Care | Payer: Medicare Other | Source: Ambulatory Visit | Attending: Internal Medicine | Admitting: Internal Medicine

## 2015-01-18 VITALS — BP 140/70 | HR 77 | Ht 59.0 in | Wt 136.8 lb

## 2015-01-18 DIAGNOSIS — R079 Chest pain, unspecified: Secondary | ICD-10-CM | POA: Diagnosis not present

## 2015-01-18 DIAGNOSIS — R0789 Other chest pain: Secondary | ICD-10-CM

## 2015-01-18 MED ORDER — FAMCICLOVIR 500 MG PO TABS: 500.0000 mg | ORAL_TABLET | Freq: Three times a day (TID) | ORAL | Status: DC

## 2015-01-18 NOTE — Progress Notes (Addendum)
Subjective:    Patient ID: Julia Lucero, female    DOB: September 21, 1935, 79 y.o.   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 Decompensated Cor Pulmonale and discovery of pulmonary hypertension on ECHO (no right heart cath) following trip to [facility] in March 2011 due to pneumonia (CT chest  march 2011 at [facility] consolidation and bialteral effusion). Negative Autoimmune Profile May 2011 except for mild raised CRP and borderline positive rheumatoid factor   March 04, 2010: feels well. Doing rehab. Not desaturating there. Due to fly to [facility] 10/20/2011on [employer]. Lot of questions on  flight o2. WE walked her today 185 feet x 3 laps and lowest o2 was only 92%. This is a huge improvement because in MAy 2011 she did desaturate with exertion. No new complaints. She continues on lasix and amlodipine started by Dr. Gwenlyn Found  in Apri 2011. Compliant with medicines. No interim problems. Denies edema, cough, orthopnea, paroxysmal nocturnal dyspnea, wheeze. She has seen Dr. Gwenlyn Found in interim and per his records he has resasured her and is following expectantly. REC: EXPECTANT FOLLOWUP  OV 11/01/2010: OVerall well. Has completed rehab. Underwent PT for muscle strengthening but now doing it on own. Unclear if has increasing dyspnea or not. AT times she denies it but at times she states she is marginally worse. Certainly seems to get tired by end of the day which might be baseline. Uses o2 wwiht exertion.  Continues lasix and amlodipine. Not seen Dr. Gwenlyn Found in interim either. No worsening edema. No wheeze. No chest pain. No syncope. No weight loss. No pnd. No orthopnea. Had 3 flight trips in interim and did well. Has repeat question on mechanism of restrictive chest disease and pulmonary hypertension. Walking desaturatin test today - and dropped to 88% - 87% after 2 laps  REC  Please continue daily exercise - make  sure heart rate is < 120-125 - you can take your heart rate that high but not higher  Make sure pulse ox is always >88% with or without oxygen  Use oxygen with exercise and walking and moving around  We will set up overnight oxygen study on room air to determine your oxygen need at night  Please see Dr. Gwenlyn Found as well  REturn to see me in 6 months  Come sooner if there are problems  OV 08/04/2011  OVerall well. Doing exercises 5-6 times per week in 2 sessions daily. Really helping. Overall stable. Class 2 dyspnea with exertion that is stable and relieved by rest. Uses o2 wwiht exertion.  Continues lasix and amlodipine. Not seen Dr. Gwenlyn Found in interim either. No worsening edema. No wheeze. No chest pain. No syncope. No weight loss. No pnd. No orthopnea. Had several flight trips in interim to [place] and did well; does DVT precaution exercises. Walking desaturatin test today - and dropped to 88% - 87% after 1 laps x 185 feet. The exertional desaturation is marginally worse than last OV. We will keep an eye on this because she is otherwise well  Past, Family, Social reviewed: no change since last visit   Please continue daily exercise - make sure heart rate is < 120-125 - you can take your heart rate that high but not higher  Make sure pulse ox is always >88% with or without oxygen  Use oxygen with exercise and walking and moving around  REturn to see me in 8 Months. Wil monitor her exertional desaturations  Come  sooner if there are problems   OV 03/30/2012 Resp wise stable. Says she feels hollow inside. Unable to explain. Spiritually weary. No other iesues. Walking desaturation test on 03/30/2012 185 feet x 1 laps:  did desaturate. Rest pulse ox was 94%, final pulse ox was 88%. HR response 93/min at rest to 114/min at peak exertion at 1 laps. The distance to desaturation is similar to Feb 2013 but worse since May 2012  [facility] Ensure heart ok with Dr Gwenlyn Found   10/20/2012 Office Visit  followup   related restrictive cage defect related to rickets   - Last seen in December 2013. After that for my recommendation she did visit cardiology Dr. Gwenlyn Found. She and husband recollect having an echocardiogram that is reported as normal per her history. Overall she's stable although she feels that over the span of 2 years she's is a little bit more fatigued than baseline especially early in the morning. She's not sure if she's had her vitamin D and thyroid function tests checked. Also of note, she went to [place] by car and return a few weeks ago. She did take several breaks every few hours and stretch the legs. Upon return she had mild pedal edema that lasted 2 days and then resolved spontaneously. There was no warmth of the leg or hemoptysis or chest pain or undue dyspnea. It is noted that she does have bilateral varicose veins. No other health issues  - Walk test today 10/20/2012: 185 feet x3 laps on room air: Pulse oximetry 86% at the third lap with a heart rate of 107   Past, Family, Social reviewed: no change since last visit    #Respiratory  - clinically you are stable with good oxygen levels for 550 feet of exertion  - suspect some deconditioning over winter; so will re-refer to pulmonary rehabilitation - continue current care  #Fatigue - talk to ARONSON,RICHARD A, MD about checking vitamin D and thyroid levels  #Followup - 9 months or sooner if needed  OV 09/29/2013  Chief Complaint  Patient presents with  . Follow-up    pt c/o SOB with exertion.     followup  related restrictive cage defect related to rickets with associated pulmonary hypertension  - Last seen in May 2014. Since then overall stable. Last visit she only desaturated on room air after walking 185 feet x3 laps. However, she continues to use 2 L nasal cannula are 24 hours. She's feels that she needs this. But otherwise he does be that she's not any worse or any better. She does say that her travel plans are unchanged but  as she ages she finds that it's more complicated to organize do to dependency her oxygen. Is no worsening edema or chest pain or cough or sputum production.  - Walk test on room air her and made her feet x3 laps: At rest her heart rate was 76 per minute. Pulse ox is 96 a minute. After exertion 185 feet x1 lap she desaturated down to 87% and she stayed down at 87%. Peak heart rate was 101. Therefore exercise was terminated. This is similar to ? Worse than a year ago  - She is somewhat anxious about her long term future. We discussed [facility] referral to pulm htn clinic and she is open to doing that  Social history: Husband suffered is herniation recently and is on conservative treatment     OV 09/08/2014  Chief Complaint  Patient presents with  . Follow-up    Pt stated  she was feeling well until catching cold. Pt c/o sinus congestion and cough. Pt currently taking levaquin. Pt c/o dry cough. Pt denies increase in SOB and CP/tightness.     Annual follow-up childhood rickets associated with thoracic cage defect associated with clinical pulmonary hypertension /cor pulmolane based on echo. Clinically she suffers from exertional hypoxemia.   I have been following Mrs. Nipper for many years. The last 1 year she reports has been stable. In the interim in summer 2015 she did go to [employer] [facility] pulmonary hypertension program. According to her history she was reassured that problems are related to thoracic cage defect and she was discharged from follow-up She does not recollect having had a right heart catheterization though chart review at [facility] suggest that she had it but I'm unable to access these results. She did have a VQ scan which was determined is a low probability for pulmonary embolism. She did have a chest x-ray that showed enlarged cardiac silhouette along with pectus excavatum. And also thoracic scoliosis. Currently overall she is doing well but a week ago she did pick up a cold and she is on a  ten-day Levaquin. Today Walking desaturation test 185 feet 3 laps on room air: She walked all 3 laps and at the very end dropped to 88%. This is probably better than a year ago   of note, exam shows bibasal crackles which I believe might be new for her rec OVerall stable Continue o2 with exertion and night and physical fitness THere appears to be new crackles on exam of lung   - do HRCT chest ; will cal with results> 09/12/14 1. No evidence of interstitial lung disease. 2. No acute findings in the thorax to account for the patient's symptoms. 3. Pectus excavatum.      01/18/2015  Acute  ov/[facility] [place] re: L CP  Chief Complaint  Patient presents with  . Acute Visit    Pt c/o pain under left breast that radiates to her back for the past few days- comes and goes. She also c/o nasal/sinus congestion and HA. Her breathing is unchanged.     ? When did you first notice the pain >  Got at least 7 different answers one of which is "not since I've been on 02"  One was x few days and one was x 10 days Better when get up and walk around. More ant than post, assoc with new rash just under L breast, worse with anti fungal rx   No obvious day to day or daytime variability or assoc chronic cough or   chest tightness, subjective wheeze or overt sinus or hb symptoms. No unusual exp hx or h/o childhood pna/ asthma or knowledge of premature birth.  Sleeping ok without nocturnal  or early am exacerbation  of respiratory  c/o's or need for noct saba. Also denies any obvious fluctuation of symptoms with weather or environmental changes or other aggravating or alleviating factors except as outlined above   Current Medications, Allergies, Complete Past Medical History, Past Surgical History, Family History, and Social History were reviewed in [employer] record.  ROS  The following are not active complaints unless bolded sore throat, dysphagia, dental problems, itching, sneezing,  nasal  congestion or excess/ purulent secretions, ear ache,   fever, chills, sweats, unintended wt loss, classically pleuritic or exertional cp, hemoptysis,  orthopnea pnd or leg swelling, presyncope, palpitations, abdominal pain, anorexia, nausea, vomiting, diarrhea  or change in bowel or bladder habits, change  in stools or urine, dysuria,hematuria,  rash, arthralgias, visual complaints, headache, numbness, weakness or ataxia or problems with walking or coordination,  change in mood/affect or memory.          Objective:   Physical Exam   Wt Readings from Last 3 Encounters:  01/18/15 136 lb 12.8 oz (62.052 kg)  09/08/14 132 lb 9.6 oz (60.147 kg)  06/22/14 132 lb 11.2 oz (60.192 kg)    Vital signs reviewed   Gen:  amb wf nad Constitutional: She is oriented to person, place, and time. She appears well-developed and well-nourished. No distress.  Short stature    HENT:  Head: Normocephalic and atraumatic.  Right Ear: External ear normal.  Left Ear: External ear normal.  Mouth/Throat: Oropharynx is clear and moist. No oropharyngeal exudate.  Eyes: Conjunctivae and EOM are normal. Pupils are equal, round, and reactive to light. Right eye exhibits no discharge. Left eye exhibits no discharge. No scleral icterus.  Neck: Normal range of motion. Neck supple. No JVD present. No tracheal deviation present. No thyromegaly present.  Cardiovascular: Normal rate, regular rhythm, normal heart sounds and intact distal pulses.  Exam reveals no gallop and no friction rub.   No murmur heard. Pulmonary/Chest: Effort normal and breath sounds normal. No respiratory distress. She has no wheezes. She has NEW CRACKL:ES AT BASEales. She exhibits no tenderness.  Kyphotic due to old rickets  Abdominal: Soft. Bowel sounds are normal. She exhibits no distension and no mass. There is no tenderness. There is no rebound and no guarding.  Musculoskeletal: Normal range of motion. She exhibits no edema and no tenderness.  Some  deformities in joints due to old rickets  Lymphadenopathy:    She has no cervical adenopathy.  Neurological: She is alert and oriented to person, place, and time. She has normal reflexes. No cranial nerve deficit. She exhibits normal muscle tone. Coordination normal.  Skin: Skin is warm and dry.  Shingles pattern rash L T8 dermatome   Psychiatric: She has a normal mood and affect. Her behavior is normal. Judgment and thought content normal.      I personally reviewed images and agree with radiology impression as follows:  CXR:  01/18/15 Pectus excavatum deformity exaggerates heart size. No acute findings.         Assessment & Plan:

## 2015-01-18 NOTE — Patient Instructions (Signed)
Please remember to go to the x-ray department downstairs for your tests - we will call you with the results when they are available.  Famvir 500 mg three times daily   See Dr Reynaldo Minium in one week

## 2015-01-19 NOTE — Progress Notes (Signed)
Quick Note:  Spoke with pt and notified of results per Dr. Wert. Pt verbalized understanding and denied any questions.  ______ 

## 2015-01-22 ENCOUNTER — Encounter: Payer: Self-pay | Admitting: Internal Medicine

## 2015-01-22 NOTE — Assessment & Plan Note (Signed)
Assoc with rash c/w shingles T8 dermatome on L > rx 01/18/15 with famvir 500 tid x 7 days   The rash is not classic for shingles but similar enough with unilateral distribution following a classic dermatomal distribution to rec rx x 7 days with famvir and then re-eval in 2 weeks   I had an extended discussion with the patient reviewing all relevant studies completed to date and  lasting 15 to 20 minutes of a 25 minute visit    Each maintenance medication was reviewed in detail including most importantly the difference between maintenance and prns and under what circumstances the prns are to be triggered using an action plan format that is not reflected in the computer generated alphabetically organized AVS.    Please see instructions for details which were reviewed in writing and the patient given a copy highlighting the part that I personally wrote and discussed at today's ov.

## 2015-01-29 ENCOUNTER — Ambulatory Visit: Payer: Medicare Other | Admitting: Adult Health

## 2015-02-19 ENCOUNTER — Ambulatory Visit: Payer: Medicare Other | Admitting: Family

## 2015-03-07 DIAGNOSIS — Z9981 Dependence on supplemental oxygen: Secondary | ICD-10-CM | POA: Diagnosis not present

## 2015-03-07 DIAGNOSIS — T783XXA Angioneurotic edema, initial encounter: Secondary | ICD-10-CM | POA: Diagnosis not present

## 2015-03-07 DIAGNOSIS — R229 Localized swelling, mass and lump, unspecified: Secondary | ICD-10-CM | POA: Diagnosis not present

## 2015-03-07 DIAGNOSIS — I10 Essential (primary) hypertension: Secondary | ICD-10-CM | POA: Diagnosis not present

## 2015-03-07 DIAGNOSIS — Z882 Allergy status to sulfonamides status: Secondary | ICD-10-CM | POA: Diagnosis not present

## 2015-03-07 DIAGNOSIS — R1310 Dysphagia, unspecified: Secondary | ICD-10-CM | POA: Diagnosis not present

## 2015-03-07 DIAGNOSIS — J449 Chronic obstructive pulmonary disease, unspecified: Secondary | ICD-10-CM | POA: Diagnosis not present

## 2015-03-07 DIAGNOSIS — J984 Other disorders of lung: Secondary | ICD-10-CM | POA: Diagnosis not present

## 2015-03-07 DIAGNOSIS — R220 Localized swelling, mass and lump, head: Secondary | ICD-10-CM | POA: Diagnosis not present

## 2015-03-07 DIAGNOSIS — Z79899 Other long term (current) drug therapy: Secondary | ICD-10-CM | POA: Diagnosis not present

## 2015-03-08 DIAGNOSIS — T783XXA Angioneurotic edema, initial encounter: Secondary | ICD-10-CM | POA: Diagnosis not present

## 2015-03-08 DIAGNOSIS — I10 Essential (primary) hypertension: Secondary | ICD-10-CM | POA: Diagnosis not present

## 2015-03-08 DIAGNOSIS — J984 Other disorders of lung: Secondary | ICD-10-CM | POA: Diagnosis not present

## 2015-03-11 ENCOUNTER — Encounter (HOSPITAL_BASED_OUTPATIENT_CLINIC_OR_DEPARTMENT_OTHER): Payer: Self-pay | Admitting: Emergency Medicine

## 2015-03-11 ENCOUNTER — Emergency Department (HOSPITAL_BASED_OUTPATIENT_CLINIC_OR_DEPARTMENT_OTHER)
Admission: EM | Admit: 2015-03-11 | Discharge: 2015-03-12 | Disposition: A | Discharge status: Home / Self Care | Payer: Medicare Other | Attending: Emergency Medicine | Admitting: Emergency Medicine

## 2015-03-11 DIAGNOSIS — Z8739 Personal history of other diseases of the musculoskeletal system and connective tissue: Secondary | ICD-10-CM | POA: Diagnosis not present

## 2015-03-11 DIAGNOSIS — Z7952 Long term (current) use of systemic steroids: Secondary | ICD-10-CM | POA: Insufficient documentation

## 2015-03-11 DIAGNOSIS — Z880 Allergy status to penicillin: Secondary | ICD-10-CM | POA: Insufficient documentation

## 2015-03-11 DIAGNOSIS — Z9981 Dependence on supplemental oxygen: Secondary | ICD-10-CM | POA: Insufficient documentation

## 2015-03-11 DIAGNOSIS — Z8619 Personal history of other infectious and parasitic diseases: Secondary | ICD-10-CM | POA: Diagnosis not present

## 2015-03-11 DIAGNOSIS — L509 Urticaria, unspecified: Secondary | ICD-10-CM | POA: Diagnosis not present

## 2015-03-11 DIAGNOSIS — R2231 Localized swelling, mass and lump, right upper limb: Secondary | ICD-10-CM | POA: Diagnosis present

## 2015-03-11 DIAGNOSIS — Z8709 Personal history of other diseases of the respiratory system: Secondary | ICD-10-CM | POA: Diagnosis not present

## 2015-03-11 DIAGNOSIS — Z79899 Other long term (current) drug therapy: Secondary | ICD-10-CM | POA: Diagnosis not present

## 2015-03-11 NOTE — ED Notes (Signed)
Pt states she was at dinner and hand started swelling

## 2015-03-12 DIAGNOSIS — L509 Urticaria, unspecified: Secondary | ICD-10-CM | POA: Diagnosis not present

## 2015-03-12 MED ORDER — DIPHENHYDRAMINE HCL 25 MG PO TABS: 50.0000 mg | ORAL_TABLET | Freq: Four times a day (QID) | ORAL | Status: DC | PRN

## 2015-03-12 MED ORDER — DIPHENHYDRAMINE HCL 50 MG/ML IJ SOLN
50.0000 mg | Freq: Once | INTRAMUSCULAR | Status: AC
  Administered 2015-03-12: 50 mg via INTRAVENOUS
  Filled 2015-03-12: qty 1

## 2015-03-12 NOTE — ED Provider Notes (Signed)
CSN: [id number]     Arrival date & time 03/11/15  2238 History  By signing my name below, I, Terrance Branch, attest that this documentation has been prepared under the direction and in the presence of Shanon Rosser, MD. Electronically Signed: Randa Evens, ED Scribe. 03/12/2015. 1:02 AM.      Chief Complaint  Patient presents with  . Arm Swelling   The history is provided by the patient. No language interpreter was used.   HPI Comments: Julia Lucero is a 79 y.o. female who presents to the Emergency Department complaining of right volar forearm swelling. Pt states that her hands began to itch (moderate to severe) about 6:30PM yesterday evening and the swelling developed while waiting in the ED. The swelling is associated with a large central urticarial lesion. Pt was recently admitted for angioedema of the face and discharged on prednisone but no antihistamines. Pt was given 50 Mg of IV benadryl with significant relief. Pt denies SOB different than baseline, throat swelling, nausea, vomiting or other related symptoms.     Past Medical History  Diagnosis Date  . Disorder of bone and cartilage, unspecified   . Chronic fatigue   . Rickettsia infection   . Restrictive lung disease   . Oxygen dependent   . Pulmonary hypertension [facility])    Past Surgical History  Procedure Laterality Date  . Oophorectomy     Family History  Problem Relation Age of Onset  . Diabetes Father   . Heart disease Father   . Hyperlipidemia Brother   . Arthritis Sister   . Colon cancer Mother   . Heart disease Mother   . Hypertension Mother   . Heart disease Maternal Grandfather   . Heart disease Paternal Grandmother   . Heart disease Paternal Grandfather   . Hyperlipidemia Brother   . Hypertension Brother    Social History  Substance Use Topics  . Smoking status: Never Smoker   . Smokeless tobacco: Never Used  . Alcohol Use: 3.6 oz/week    6 Standard drinks or equivalent per week   OB History     No data available     Review of Systems A complete 10 system review of systems was obtained and all systems are negative except as noted in the HPI and PMH.   Allergies  Penicillins; Sulfonamide derivatives; and Amoxicillin  Home Medications   Prior to Admission medications   Medication Sig Start Date End Date Taking? Authorizing Provider  predniSONE (DELTASONE) 20 MG tablet Take 20 mg by mouth daily with breakfast.   Yes Historical Provider, MD  amLODipine (NORVASC) 5 MG tablet TAKE 1 TABLET DAILY. 07/27/13   Lorretta Harp, MD  furosemide (LASIX) 40 MG tablet Take 0.5 tablet (20 mg total) by mouth daily as directed. 08/07/14   Lorretta Harp, MD  Multiple Vitamin (MULTIVITAMIN) capsule Take 1 capsule by mouth daily.      Historical Provider, MD  OXYGEN-HELIUM IN Inhale 2 L into the lungs continuous.    Historical Provider, MD   BP 157/58 mmHg  Pulse 77  Temp(Src) 97.8 F (36.6 C) (Oral)  Resp 18  Ht 4\' 11"  (1.499 m)  Wt 135 lb (61.236 kg)  BMI 27.25 kg/m2  SpO2 98%   Physical Exam General: Well-developed, well-nourished female in no acute distress; appearance consistent with age of record HENT: normocephalic; atraumatic Eyes: pupils equal, round and reactive to light; extraocular muscles intact; Arcus senilis bilaterally Neck: supple Heart: regular rate and rhythm; distant sounds  Lungs: clear to auscultation bilaterally; distant sounds Abdomen: soft; nondistended; nontender;bowel sounds present Extremities: No deformity; full range of motion; pulses normal Neurologic: Awake, alert and oriented; motor function intact in all extremities and symmetric; no facial droop Skin: Warm and dry; resolving urticaria and erythema of the right forearm; ecchymoses of the right forearm at sites of previous IV Psychiatric: Normal mood and affect  ED Course  Procedures (including critical care time) DIAGNOSTIC STUDIES: Oxygen Saturation is 98% on 2L [place], normal by my interpretation.     COORDINATION OF CARE: 1:01 AM-Discussed treatment plan with pt at bedside and pt agreed to plan.     MDM  I personally performed the services described in this documentation, which was scribed in my presence. The recorded information has been reviewed and is accurate.    Shanon Rosser, MD 03/12/15 [phone]

## 2015-03-12 NOTE — ED Notes (Signed)
Dr. Florina Ou to bedside to briefly assess pt for possible cellulitis vs allergic reaction. Provided verbal order for 50mg  benadryl IV.

## 2015-03-13 ENCOUNTER — Ambulatory Visit (INDEPENDENT_AMBULATORY_CARE_PROVIDER_SITE_OTHER): Payer: Medicare Other | Admitting: Family

## 2015-03-13 ENCOUNTER — Encounter: Payer: Self-pay | Admitting: Family

## 2015-03-13 ENCOUNTER — Encounter (INDEPENDENT_AMBULATORY_CARE_PROVIDER_SITE_OTHER): Payer: Self-pay

## 2015-03-13 VITALS — BP 122/52 | HR 66 | Temp 97.8°F | Wt 135.0 lb

## 2015-03-13 DIAGNOSIS — T783XXD Angioneurotic edema, subsequent encounter: Secondary | ICD-10-CM | POA: Diagnosis not present

## 2015-03-13 DIAGNOSIS — T783XXA Angioneurotic edema, initial encounter: Secondary | ICD-10-CM | POA: Insufficient documentation

## 2015-03-13 NOTE — Assessment & Plan Note (Signed)
Symptoms appear resolved from possible reaction to food. Completed prednisone today. Change diphenhydramine to as needed for itching. No further treatment indicated at this time.  Follow up if symptoms return.

## 2015-03-13 NOTE — Progress Notes (Signed)
Subjective:    Patient ID: Julia Lucero, female    DOB: [birth year], 79 y.o.   MRN: [id number]  Chief Complaint  Patient presents with  . Establish Care    ED follow up    HPI:  Julia Lucero is a 79 y.o. female who  has a past medical history of Disorder of bone and cartilage, unspecified; Chronic fatigue; Rickettsia infection; Restrictive lung disease; Oxygen dependent; and Pulmonary hypertension ([place]). and presents today for an office visit to establish care.    1.) Allergic reaction -  Recently seen in the ED for right volar arm swelling and angioedema following a potential food allergy. She was given an injection of 50 mg of Benedryl. She also completed a twice daily 20 mg prednisone from a hospital in [place]. Since leaving the ED she the swelling has been decreased with only occasional itching in her eyes. Denies angioedema, shortness of breath or other symptoms of anaphylaxis. She does continue to take the benadryl that does help.   Allergies  Allergen Reactions  . Methocarbamol     Altered thought process  . Penicillins   . Sulfonamide Derivatives   . Amoxicillin Rash     Outpatient Prescriptions Prior to Visit  Medication Sig Dispense Refill  . amLODipine (NORVASC) 5 MG tablet TAKE 1 TABLET DAILY. 30 tablet 10  . diphenhydrAMINE (BENADRYL) 25 MG tablet Take 2 tablets (50 mg total) by mouth every 6 (six) hours as needed (for itching or hives).    . furosemide (LASIX) 40 MG tablet Take 0.5 tablet (20 mg total) by mouth daily as directed. 15 tablet 11  . Multiple Vitamin (MULTIVITAMIN) capsule Take 1 capsule by mouth daily.      . OXYGEN-HELIUM IN Inhale 2 L into the lungs continuous.    . predniSONE (DELTASONE) 20 MG tablet Take 20 mg by mouth daily with breakfast.     No facility-administered medications prior to visit.     Past Medical History  Diagnosis Date  . Disorder of bone and cartilage, unspecified   . Chronic fatigue   . Rickettsia infection   .  Restrictive lung disease   . Oxygen dependent   . Pulmonary hypertension [facility])      Past Surgical History  Procedure Laterality Date  . Oophorectomy       Family History  Problem Relation Age of Onset  . Diabetes Father   . Heart disease Father   . Hyperlipidemia Brother   . Arthritis Sister   . Colon cancer Mother   . Heart disease Mother   . Hypertension Mother   . Heart disease Maternal Grandfather   . Heart disease Paternal Grandmother   . Heart disease Paternal Grandfather   . Hyperlipidemia Brother   . Hypertension Brother      Social History   Social History  . Marital Status: Married    Spouse Name: N/A  . Number of Children: 2  . Years of Education: 18   Occupational History  . Not on file.   Social History Main Topics  . Smoking status: Never Smoker   . Smokeless tobacco: Never Used  . Alcohol Use: 3.6 oz/week    6 Standard drinks or equivalent per week     Comment: Rarely  . Drug Use: No  . Sexual Activity: Not on file   Other Topics Concern  . Not on file   Social History Narrative   Fun: Read, exercise, keep up with family   Denies abuse  and feels safe at home.        Review of Systems  Constitutional: Negative for fever and chills.  HENT: Negative for facial swelling.   Respiratory: Negative for cough, chest tightness and wheezing.   Cardiovascular: Negative for chest pain, palpitations and leg swelling.      Objective:    BP 122/52 mmHg  Pulse 66  Temp(Src) 97.8 F (36.6 C)  Wt 135 lb (61.236 kg)  SpO2 99% Nursing note and vital signs reviewed.  Physical Exam  Constitutional: She is oriented to person, place, and time. She appears well-developed and well-nourished. No distress.  Elderly female seated in the chair with oxygen via nasal cannula. Dressed appropriately and appears younger than her stated age.   HENT:  Right Ear: Hearing, tympanic membrane, external ear and ear canal normal.  Left Ear: Hearing, tympanic  membrane, external ear and ear canal normal.  Nose: Nose normal.  Mouth/Throat: Uvula is midline, oropharynx is clear and moist and mucous membranes are normal.  Cardiovascular: Normal rate, regular rhythm, normal heart sounds and intact distal pulses.   Pulmonary/Chest: Effort normal and breath sounds normal.  Neurological: She is alert and oriented to person, place, and time.  Skin: Skin is warm and dry.  Psychiatric: She has a normal mood and affect. Her behavior is normal. Judgment and thought content normal.       Assessment & Plan:   Problem List Items Addressed This Visit      Other   Angioedema - Primary    Symptoms appear resolved from possible reaction to food. Completed prednisone today. Change diphenhydramine to as needed for itching. No further treatment indicated at this time.  Follow up if symptoms return.

## 2015-03-13 NOTE — Progress Notes (Signed)
Pre visit review using our clinic review tool, if applicable. No additional management support is needed unless otherwise documented below in the visit note. 

## 2015-03-13 NOTE — Patient Instructions (Signed)
Thank you for choosing [facility].  Summary/Instructions:  Please schedule a time for your annual wellness exam.   If your symptoms worsen or fail to improve, please contact our office for further instruction, or in case of emergency go directly to the emergency room at the closest medical facility.    TREATMENT  Treatment initially involves the use of ice and medication to help reduce pain and inflammation. It is also important to perform strengthening and stretching exercises and modify activities that worsen symptoms so the injury does not get worse. These exercises may be performed at home or with a therapist. For patients who experience severe symptoms, a soft, padded collar may be recommended to be worn around the neck.  Improving your posture may help reduce symptoms. Posture improvement includes pulling your chin and abdomen in while sitting or standing. If you are sitting, sit in a firm chair with your buttocks against the back of the chair. While sleeping, try replacing your pillow with a small towel rolled to 2 inches in diameter, or use a cervical pillow or soft cervical collar. Poor sleeping positions delay healing.  For patients with nerve root damage, which causes numbness or weakness, the use of a cervical traction apparatus may be recommended. Surgery is rarely necessary for these injuries. However, cervical strain and sprains that are present at birth (congenital) may require surgery. MEDICATION   If pain medication is necessary, nonsteroidal anti-inflammatory medications, such as aspirin and ibuprofen, or other minor pain relievers, such as acetaminophen, are often recommended.  Do not take pain medication for 7 days before surgery.  Prescription pain relievers may be given if deemed necessary by your caregiver. Use only as directed and only as much as you need. HEAT AND COLD:   Cold treatment (icing) relieves pain and reduces inflammation. Cold treatment should be  applied for 10 to 15 minutes every 2 to 3 hours for inflammation and pain and immediately after any activity that aggravates your symptoms. Use ice packs or an ice massage.  Heat treatment may be used prior to performing the stretching and strengthening activities prescribed by your caregiver, physical therapist, or athletic trainer. Use a heat pack or a warm soak. SEEK MEDICAL CARE IF:   Symptoms get worse or do not improve in 2 weeks despite treatment.  New, unexplained symptoms develop (drugs used in treatment may produce side effects). EXERCISES RANGE OF MOTION (ROM) AND STRETCHING EXERCISES - Cervical Strain and Sprain These exercises may help you when beginning to rehabilitate your injury. In order to successfully resolve your symptoms, you must improve your posture. These exercises are designed to help reduce the forward-head and rounded-shoulder posture which contributes to this condition. Your symptoms may resolve with or without further involvement from your physician, physical therapist or athletic trainer. While completing these exercises, remember:   Restoring tissue flexibility helps normal motion to return to the joints. This allows healthier, less painful movement and activity.  An effective stretch should be held for at least 20 seconds, although you may need to begin with shorter hold times for comfort.  A stretch should never be painful. You should only feel a gentle lengthening or release in the stretched tissue. STRETCH- Axial Extensors  Lie on your back on the floor. You may bend your knees for comfort. Place a rolled-up hand towel or dish towel, about 2 inches in diameter, under the part of your head that makes contact with the floor.  Gently tuck your chin, as if trying  to make a "double chin," until you feel a gentle stretch at the base of your head.  Hold __________ seconds. Repeat __________ times. Complete this exercise __________ times per day.  STRETCH - Axial  Extension   Stand or sit on a firm surface. Assume a good posture: chest up, shoulders drawn back, abdominal muscles slightly tense, knees unlocked (if standing) and feet hip width apart.  Slowly retract your chin so your head slides back and your chin slightly lowers. Continue to look straight ahead.  You should feel a gentle stretch in the back of your head. Be certain not to feel an aggressive stretch since this can cause headaches later.  Hold for __________ seconds. Repeat __________ times. Complete this exercise __________ times per day. STRETCH - Cervical Side Bend   Stand or sit on a firm surface. Assume a good posture: chest up, shoulders drawn back, abdominal muscles slightly tense, knees unlocked (if standing) and feet hip width apart.  Without letting your nose or shoulders move, slowly tip your right / left ear to your shoulder until your feel a gentle stretch in the muscles on the opposite side of your neck.  Hold __________ seconds. Repeat __________ times. Complete this exercise __________ times per day. STRETCH - Cervical Rotators   Stand or sit on a firm surface. Assume a good posture: chest up, shoulders drawn back, abdominal muscles slightly tense, knees unlocked (if standing) and feet hip width apart.  Keeping your eyes level with the ground, slowly turn your head until you feel a gentle stretch along the back and opposite side of your neck.  Hold __________ seconds. Repeat __________ times. Complete this exercise __________ times per day. RANGE OF MOTION - Neck Circles   Stand or sit on a firm surface. Assume a good posture: chest up, shoulders drawn back, abdominal muscles slightly tense, knees unlocked (if standing) and feet hip width apart.  Gently roll your head down and around from the back of one shoulder to the back of the other. The motion should never be forced or painful.  Repeat the motion 10-20 times, or until you feel the neck muscles relax and  loosen. Repeat __________ times. Complete the exercise __________ times per day. STRENGTHENING EXERCISES - Cervical Strain and Sprain These exercises may help you when beginning to rehabilitate your injury. They may resolve your symptoms with or without further involvement from your physician, physical therapist, or athletic trainer. While completing these exercises, remember:   Muscles can gain both the endurance and the strength needed for everyday activities through controlled exercises.  Complete these exercises as instructed by your physician, physical therapist, or athletic trainer. Progress the resistance and repetitions only as guided.  You may experience muscle soreness or fatigue, but the pain or discomfort you are trying to eliminate should never worsen during these exercises. If this pain does worsen, stop and make certain you are following the directions exactly. If the pain is still present after adjustments, discontinue the exercise until you can discuss the trouble with your clinician. STRENGTH - Cervical Flexors, Isometric  Face a wall, standing about 6 inches away. Place a small pillow, a ball about 6-8 inches in diameter, or a folded towel between your forehead and the wall.  Slightly tuck your chin and gently push your forehead into the soft object. Push only with mild to moderate intensity, building up tension gradually. Keep your jaw and forehead relaxed.  Hold 10 to 20 seconds. Keep your breathing relaxed.  Release  the tension slowly. Relax your neck muscles completely before you start the next repetition. Repeat __________ times. Complete this exercise __________ times per day. STRENGTH- Cervical Lateral Flexors, Isometric   Stand about 6 inches away from a wall. Place a small pillow, a ball about 6-8 inches in diameter, or a folded towel between the side of your head and the wall.  Slightly tuck your chin and gently tilt your head into the soft object. Push only with  mild to moderate intensity, building up tension gradually. Keep your jaw and forehead relaxed.  Hold 10 to 20 seconds. Keep your breathing relaxed.  Release the tension slowly. Relax your neck muscles completely before you start the next repetition. Repeat __________ times. Complete this exercise __________ times per day. STRENGTH - Cervical Extensors, Isometric   Stand about 6 inches away from a wall. Place a small pillow, a ball about 6-8 inches in diameter, or a folded towel between the back of your head and the wall.  Slightly tuck your chin and gently tilt your head back into the soft object. Push only with mild to moderate intensity, building up tension gradually. Keep your jaw and forehead relaxed.  Hold 10 to 20 seconds. Keep your breathing relaxed.  Release the tension slowly. Relax your neck muscles completely before you start the next repetition. Repeat __________ times. Complete this exercise __________ times per day. POSTURE AND BODY MECHANICS CONSIDERATIONS - Cervical Strain and Sprain Keeping correct posture when sitting, standing or completing your activities will reduce the stress put on different body tissues, allowing injured tissues a chance to heal and limiting painful experiences. The following are general guidelines for improved posture. Your physician or physical therapist will provide you with any instructions specific to your needs. While reading these guidelines, remember:  The exercises prescribed by your provider will help you have the flexibility and strength to maintain correct postures.  The correct posture provides the optimal environment for your joints to work. All of your joints have less wear and tear when properly supported by a spine with good posture. This means you will experience a healthier, less painful body.  Correct posture must be practiced with all of your activities, especially prolonged sitting and standing. Correct posture is as important when  doing repetitive low-stress activities (typing) as it is when doing a single heavy-load activity (lifting). PROLONGED STANDING WHILE SLIGHTLY LEANING FORWARD When completing a task that requires you to lean forward while standing in one place for a long time, place either foot up on a stationary 2- to 4-inch high object to help maintain the best posture. When both feet are on the ground, the low back tends to lose its slight inward curve. If this curve flattens (or becomes too large), then the back and your other joints will experience too much stress, fatigue more quickly, and can cause pain.  RESTING POSITIONS Consider which positions are most painful for you when choosing a resting position. If you have pain with flexion-based activities (sitting, bending, stooping, squatting), choose a position that allows you to rest in a less flexed posture. You would want to avoid curling into a fetal position on your side. If your pain worsens with extension-based activities (prolonged standing, working overhead), avoid resting in an extended position such as sleeping on your stomach. Most people will find more comfort when they rest with their spine in a more neutral position, neither too rounded nor too arched. Lying on a non-sagging bed on your side with a  pillow between your knees, or on your back with a pillow under your knees will often provide some relief. Keep in mind, being in any one position for a prolonged period of time, no matter how correct your posture, can still lead to stiffness. WALKING Walk with an upright posture. Your ears, shoulders, and hips should all line up. OFFICE WORK When working at a desk, create an environment that supports good, upright posture. Without extra support, muscles fatigue and lead to excessive strain on joints and other tissues. CHAIR:  A chair should be able to slide under your desk when your back makes contact with the back of the chair. This allows you to work  closely.  The chair's height should allow your eyes to be level with the upper part of your monitor and your hands to be slightly lower than your elbows.  Body position:  Your feet should make contact with the floor. If this is not possible, use a foot rest.  Keep your ears over your shoulders. This will reduce stress on your neck and low back. Document Released: 05/26/2005 Document Revised: 10/10/2013 Document Reviewed: 09/07/2008 [facility] Patient Information 2015 [place], [place]. This information is not intended to replace advice given to you by your health care provider. Make sure you discuss any questions you have with your health care provider.

## 2015-03-16 ENCOUNTER — Encounter: Payer: Self-pay | Admitting: Family Medicine

## 2015-03-16 ENCOUNTER — Telehealth: Payer: Self-pay | Admitting: Family

## 2015-03-16 ENCOUNTER — Ambulatory Visit (INDEPENDENT_AMBULATORY_CARE_PROVIDER_SITE_OTHER): Payer: Medicare Other | Admitting: Family Medicine

## 2015-03-16 VITALS — BP 122/62 | HR 76 | Temp 97.8°F | Wt 133.1 lb

## 2015-03-16 DIAGNOSIS — L509 Urticaria, unspecified: Secondary | ICD-10-CM | POA: Diagnosis not present

## 2015-03-16 MED ORDER — TRIAMCINOLONE ACETONIDE 0.1 % EX OINT: 1.0000 "application " | TOPICAL_OINTMENT | Freq: Two times a day (BID) | CUTANEOUS | Status: DC

## 2015-03-16 NOTE — Telephone Encounter (Signed)
PLEASE NOTE: All timestamps contained within this report are represented as [place] Standard Time. CONFIDENTIALTY NOTICE: This fax transmission is intended only for the addressee. It contains information that is legally privileged, confidential or otherwise protected from use or disclosure. If you are not the intended recipient, you are strictly prohibited from reviewing, disclosing, copying using or disseminating any of this information or taking any action in reliance on or regarding this information. If you have received this fax in error, please notify us immediately by telephone so that we can arrange for its return to [place]. Phone: [phone], Toll-Free: [phone], Fax: [phone] Page: 1 of 1 Call Id: [id number] [facility] - Client [facility] Patient Name: Julia Lucero DOB: 1935-09-19 Initial Comment Caller states she was seen Tuesday for hives, red and itchy today Nurse Assessment Nurse: Markus Daft, RN, Sherre Poot Date/Time (Eastern Time): 03/16/2015 10:12:45 AM Confirm and document reason for call. If symptomatic, describe symptoms. ---Caller states she a week ago, Wednesday AM, she had swelling of mouth, lips, tongue and taken to ER and observed for 24 hours. Then again to ER on Sunday - Monday AM for swelling in the arm. She was seen Tuesday and was ok with no swelling at the time. She has a itchy swollen red circle x 1 and a red line leaving from it more than 2 inches which noticed last night. Took Benadryl last night, and this AM. Has the patient traveled out of the country within the last 30 days? ---Not Applicable Does the patient have any new or worsening symptoms? ---Yes Will a triage be completed? ---Yes Related visit to physician within the last 2 weeks? ---Yes Does the PT have any chronic conditions? (i.e. diabetes, asthma, etc.) ---Yes List chronic conditions. ---HTN, CRTD Guidelines Guideline Title  Affirmed Question Affirmed Notes Rash or Redness - Localized [1] Looks infected (spreading redness, pus) AND [2] large red area (> 2 in. or 5 cm) ---->spreading redness Final Disposition User See Physician within 4 Hours (or PCP triage) Markus Daft, RN, Sherre Poot Comments Appt made with Thersa Salt today at 11:15 am. Husband, [name], helped assist in Triage. Referrals REFERRED TO PCP OFFICE Disagree/Comply: Comply

## 2015-03-16 NOTE — Assessment & Plan Note (Signed)
Exam is consistent with hives. Treating with topical steroid (triamcinolone given today) and oral antihistamines. Discussed allergy referral with patient today given recent angioedema and urticaria. Patient was initially reluctant but then decided to proceed with allergy referral. Referral placed today.

## 2015-03-16 NOTE — Patient Instructions (Signed)
It was nice to see you today.  Use the triamcinolone twice daily for the next 1-2 weeks.  Follow up closely with your PCP.  Take care  Dr. Lacinda Axon

## 2015-03-16 NOTE — Progress Notes (Signed)
   Subjective:  Patient ID: Julia Lucero, female    DOB: [birth year]  Age: 79 y.o. MRN: [id number]  CC: Rash  HPI:  79 year old female with a past medical history of cor pulmonale presents for an acute visit with complaints of rash.  Rash  Last Wednesday patient was admitted for observation following an episode of angioedema.   She was treated with antihistamines steroids and discharged home.  On 10/3 she developed a rash on her right arm and presented to the emergency department and [place]. Her exam was consistent with urticaria and she was treated with IV Benadryl.  Today with similar complaints.   She reports that last night she developed a localized rash on her left upper arm.  No known inciting factor.  She reports associated itching.  She has taken Benadryl with improvement in her itching but not of the rash itself.  Known exacerbating factors.  No other associated symptoms.   Social Hx   Social History   Social History  . Marital Status: Married    Spouse Name: N/A  . Number of Children: 2  . Years of Education: 39   Social History Main Topics  . Smoking status: Never Smoker   . Smokeless tobacco: Never Used  . Alcohol Use: 3.6 oz/week    6 Standard drinks or equivalent per week     Comment: Rarely  . Drug Use: No  . Sexual Activity: Not Asked   Other Topics Concern  . None   Social History Narrative   Fun: Read, exercise, keep up with family   Denies abuse and feels safe at home.    Review of Systems  Constitutional: Negative.   Skin: Positive for rash.    Objective:  BP 122/62 mmHg  Pulse 76  Temp(Src) 97.8 F (36.6 C) (Oral)  Wt 133 lb 1.9 oz (60.383 kg)  SpO2 96%  BP/Weight 03/16/2015 03/13/2015 56/08/1495  Systolic BP 026 378 588  Diastolic BP 62 52 70  Wt. (Lbs) 133.12 135 -  BMI 26.87 27.25 -   Physical Exam  Constitutional:  Chronically ill-appearing. No acute distress. Wearing supplemental oxygen.  Neurological: She is  alert.  Skin:  Large raised area with erythema around the perimeter. Location: Medial upper arm (left). Measures approximately 10 cm x 7 cm  Psychiatric: She has a normal mood and affect.   Assessment & Plan:   Problem List Items Addressed This Visit    Urticaria - Primary    Exam is consistent with hives. Treating with topical steroid (triamcinolone given today) and oral antihistamines. Discussed allergy referral with patient today given recent angioedema and urticaria. Patient was initially reluctant but then decided to proceed with allergy referral. Referral placed today.      Relevant Orders   Ambulatory referral to Allergy      Meds ordered this encounter  Medications  . triamcinolone ointment (KENALOG) 0.1 %    Sig: Apply 1 application topically 2 (two) times daily.    Dispense:  30 g    Refill:  0    Follow-up: PRN  Thersa Salt, DO

## 2015-03-19 ENCOUNTER — Emergency Department (HOSPITAL_BASED_OUTPATIENT_CLINIC_OR_DEPARTMENT_OTHER)
Admission: EM | Admit: 2015-03-19 | Discharge: 2015-03-19 | Disposition: A | Discharge status: Home / Self Care | Payer: Medicare Other | Attending: Emergency Medicine | Admitting: Emergency Medicine

## 2015-03-19 ENCOUNTER — Encounter (HOSPITAL_BASED_OUTPATIENT_CLINIC_OR_DEPARTMENT_OTHER): Payer: Self-pay | Admitting: Emergency Medicine

## 2015-03-19 DIAGNOSIS — Z8619 Personal history of other infectious and parasitic diseases: Secondary | ICD-10-CM | POA: Diagnosis not present

## 2015-03-19 DIAGNOSIS — I10 Essential (primary) hypertension: Secondary | ICD-10-CM | POA: Diagnosis not present

## 2015-03-19 DIAGNOSIS — Z8709 Personal history of other diseases of the respiratory system: Secondary | ICD-10-CM | POA: Diagnosis not present

## 2015-03-19 DIAGNOSIS — R220 Localized swelling, mass and lump, head: Secondary | ICD-10-CM | POA: Diagnosis present

## 2015-03-19 DIAGNOSIS — T783XXD Angioneurotic edema, subsequent encounter: Secondary | ICD-10-CM | POA: Diagnosis not present

## 2015-03-19 DIAGNOSIS — Z9981 Dependence on supplemental oxygen: Secondary | ICD-10-CM | POA: Diagnosis not present

## 2015-03-19 DIAGNOSIS — Z79899 Other long term (current) drug therapy: Secondary | ICD-10-CM | POA: Insufficient documentation

## 2015-03-19 DIAGNOSIS — Z8739 Personal history of other diseases of the musculoskeletal system and connective tissue: Secondary | ICD-10-CM | POA: Diagnosis not present

## 2015-03-19 DIAGNOSIS — Z880 Allergy status to penicillin: Secondary | ICD-10-CM | POA: Diagnosis not present

## 2015-03-19 DIAGNOSIS — X58XXXD Exposure to other specified factors, subsequent encounter: Secondary | ICD-10-CM | POA: Diagnosis not present

## 2015-03-19 MED ORDER — PREDNISONE 20 MG PO TABS: 40.0000 mg | ORAL_TABLET | Freq: Every day | ORAL | Status: DC

## 2015-03-19 MED ORDER — FAMOTIDINE 20 MG PO TABS
20.0000 mg | ORAL_TABLET | Freq: Once | ORAL | Status: AC
  Administered 2015-03-19: 20 mg via ORAL
  Filled 2015-03-19: qty 1

## 2015-03-19 MED ORDER — DIPHENHYDRAMINE HCL 50 MG/ML IJ SOLN
25.0000 mg | Freq: Once | INTRAMUSCULAR | Status: AC
  Administered 2015-03-19: 25 mg via INTRAVENOUS
  Filled 2015-03-19: qty 1

## 2015-03-19 MED ORDER — METHYLPREDNISOLONE SODIUM SUCC 125 MG IJ SOLR
125.0000 mg | Freq: Once | INTRAMUSCULAR | Status: AC
  Administered 2015-03-19: 125 mg via INTRAVENOUS
  Filled 2015-03-19: qty 2

## 2015-03-19 NOTE — ED Notes (Signed)
I gave patient ice water after Dr. Christie Beckers.

## 2015-03-19 NOTE — ED Notes (Signed)
Dr Tamera Punt in to see pt. Pt ordered Benadryl 25mg  IV and pt reports having 50 mg po at home at 12 noon. Dr Tamera Punt has okayed for pt to have 25 mg of IV benadryl.

## 2015-03-19 NOTE — ED Provider Notes (Signed)
CSN: [id number]     Arrival date & time 03/19/15  1350 History   First MD Initiated Contact with Patient 03/19/15 1401     Chief Complaint  Patient presents with  . Allergic Reaction     (Consider location/radiation/quality/duration/timing/severity/associated sxs/prior Treatment) HPI Comments: Pt is a 79 yo female who presents to the ED with complaint of facial swelling, onset noon. Pt reports having right sided facial swelling that she noticed when she woke up form her nap around noon today. Denies fever, chills, headache, SOB, throat swelling, dysphagia, cough, wheezing, CP, abdominal pain, N/V/D, numbness, tingling, weakness. She notes she took 2 benadryl at home PTA. Pt also reports she was seen at ED in [place] 2 weeks ago for facial swelling, was observed for 24 hours and d/c home with steroids, then was seen in our ED last week for forearm swelling and was d/c home. Pt reports she finished her most recent steroids 1 week ago and last saw her PCP last week for follow up from her most recent ED visit. She notes her PCP working on a referral to an allergist. Pt is also on home O2 due to [employer].    Past Medical History  Diagnosis Date  . Disorder of bone and cartilage, unspecified   . Chronic fatigue   . Rickettsia infection   . Restrictive lung disease   . Oxygen dependent   . Pulmonary hypertension [facility])    Past Surgical History  Procedure Laterality Date  . Oophorectomy     Family History  Problem Relation Age of Onset  . Diabetes Father   . Heart disease Father   . Hyperlipidemia Brother   . Arthritis Sister   . Colon cancer Mother   . Heart disease Mother   . Hypertension Mother   . Heart disease Maternal Grandfather   . Heart disease Paternal Grandmother   . Heart disease Paternal Grandfather   . Hyperlipidemia Brother   . Hypertension Brother    Social History  Substance Use Topics  . Smoking status: Never Smoker   . Smokeless tobacco: Never Used  . Alcohol  Use: 3.6 oz/week    6 Standard drinks or equivalent per week     Comment: Rarely   OB History    No data available     Review of Systems  HENT: Positive for facial swelling.   All other systems reviewed and are negative.     Allergies  Methocarbamol; Penicillins; Sulfonamide derivatives; and Amoxicillin  Home Medications   Prior to Admission medications   Medication Sig Start Date End Date Taking? Authorizing Provider  amLODipine (NORVASC) 5 MG tablet TAKE 1 TABLET DAILY. 07/27/13   Lorretta Harp, MD  diphenhydrAMINE (BENADRYL) 25 MG tablet Take 2 tablets (50 mg total) by mouth every 6 (six) hours as needed (for itching or hives). 03/12/15   John Molpus, MD  furosemide (LASIX) 40 MG tablet Take 0.5 tablet (20 mg total) by mouth daily as directed. 08/07/14   Lorretta Harp, MD  Multiple Vitamin (MULTIVITAMIN) capsule Take 1 capsule by mouth daily.      Historical Provider, MD  OXYGEN-HELIUM IN Inhale 2 L into the lungs continuous.    Historical Provider, MD  triamcinolone ointment (KENALOG) 0.1 % Apply 1 application topically 2 (two) times daily. 03/16/15   Jayce G Cook, DO   BP 130/58 mmHg  Pulse 74  Temp(Src) 98.2 F (36.8 C) (Oral)  Resp 20  Ht 4\' 11"  (1.499 m)  Wt 133  lb (60.328 kg)  BMI 26.85 kg/m2  SpO2 100% Physical Exam  Constitutional: She is oriented to person, place, and time. She appears well-developed and well-nourished.  HENT:  Head: Normocephalic and atraumatic.  Nose: Nose normal.  Mouth/Throat: Uvula is midline, oropharynx is clear and moist and mucous membranes are normal. No uvula swelling. No oropharyngeal exudate or posterior oropharyngeal edema.  Mild right sided facial swelling noted to upper lip and maxilla area. No ludwig's angina  Eyes: Conjunctivae and EOM are normal. Right eye exhibits no discharge. Left eye exhibits no discharge. No scleral icterus.  Neck: Normal range of motion. Neck supple.  Cardiovascular: Normal rate, regular rhythm,  normal heart sounds and intact distal pulses.   No murmur heard. Pulmonary/Chest: Effort normal and breath sounds normal. No respiratory distress. She has no wheezes. She has no rales. She exhibits no tenderness.  Abdominal: Soft. Bowel sounds are normal. She exhibits no distension and no mass. There is no tenderness. There is no rebound and no guarding.  Musculoskeletal: She exhibits no edema.  Lymphadenopathy:    She has no cervical adenopathy.  Neurological: She is alert and oriented to person, place, and time.  Skin: Skin is warm and dry.  Nursing note and vitals reviewed.   ED Course  Procedures (including critical care time) Labs Review Labs Reviewed - No data to display  Imaging Review No results found. I have personally reviewed and evaluated these images and lab results as part of my medical decision-making.    MDM   Final diagnoses:  None    Pt presents with right sided facial swelling. No dyspnea, dysphagia, wheezing. No reported new irritants, meds, food. Pt with prior history of allergic reactions over the past few weeks. VSS. Mild swelling noted to right upper lip and maxilla region, no respiratory distress, lungs CTAB, no ludwig's angina. Pt given IV benadryl, pepcid and solumedrol in the ED. Swelling has improved in the ED. Plan to observe pt in the ED for 2-3 hours and then plan to d/c pt home with steroids and advise pt to follow up with PCP in 24 hours.   Hand-off to [employer], PA-C, discussed plan for obs for 1-2 hours and then d/c home if swelling has continued to improve.    Chesley Noon Dixon, [place] 03/19/15 [place], MD 03/21/15 1123

## 2015-03-19 NOTE — ED Notes (Signed)
Husband at desk wanting to go. Instructed PA was aware and would be in to see them.

## 2015-03-19 NOTE — Discharge Instructions (Signed)
Take Prednisone as prescribed for 5 days. Follow up with your primary care provider in 24 hours. Please return to the Emergency Department if symptoms worsen or new onset of difficulty swallowing, difficulty breathing, worsening swelling, wheezing.

## 2015-03-19 NOTE — ED Notes (Signed)
Pt states she feels better and wants to go home. Will make MD aware. Less swelling noted in face and no redness.

## 2015-03-19 NOTE — ED Notes (Signed)
Patient reports that she started to have right facial swelling noted at noon. The patient took 2 benadryl at home at about 1 pm. The patient denies any SOB, or trouble swallowing.

## 2015-03-19 NOTE — ED Notes (Signed)
Pt is on home O2 at 2l/m [place]. Pt placed on wall unit at 2l/m [place].

## 2015-03-20 ENCOUNTER — Telehealth: Payer: Self-pay | Admitting: Family

## 2015-03-30 ENCOUNTER — Telehealth: Payer: Self-pay | Admitting: Internal Medicine

## 2015-03-30 ENCOUNTER — Encounter: Payer: Self-pay | Admitting: Family

## 2015-03-30 ENCOUNTER — Ambulatory Visit (INDEPENDENT_AMBULATORY_CARE_PROVIDER_SITE_OTHER): Payer: Medicare Other | Admitting: Family

## 2015-03-30 VITALS — BP 144/64 | HR 76 | Temp 97.7°F | Resp 18 | Ht 59.0 in | Wt 134.0 lb

## 2015-03-30 DIAGNOSIS — T783XXD Angioneurotic edema, subsequent encounter: Secondary | ICD-10-CM

## 2015-03-30 MED ORDER — MONTELUKAST SODIUM 10 MG PO TABS: 10.0000 mg | ORAL_TABLET | Freq: Every day | ORAL | Status: DC

## 2015-03-30 MED ORDER — PREDNISONE 20 MG PO TABS: 20.0000 mg | ORAL_TABLET | Freq: Two times a day (BID) | ORAL | Status: DC

## 2015-03-30 NOTE — Assessment & Plan Note (Signed)
Symptoms and exam consistent with resolving angioedema. Start montelukast. Allergy referral in process. Written prescription for prednisone given if montelukast test does not reduce symptoms. Follow-up for any worsening symptoms or symptoms of anaphylaxis.

## 2015-03-30 NOTE — Progress Notes (Signed)
Subjective:    Patient ID: Julia Lucero, female    DOB: 10-Jan-1936, 79 y.o.   MRN: [id number]  Chief Complaint  Patient presents with  . Facial Swelling    has swelling around her right eye, a sporadic swelling that occurs in random areas,    HPI:  Julia Lucero is a 79 y.o. female who  has a past medical history of Disorder of bone and cartilage, unspecified; Chronic fatigue; Rickettsia infection; Restrictive lung disease; Oxygen dependent; and Pulmonary hypertension ([place]). and presents today for an acute office visit.   Associated symptom swelling located in her right eye and sporadically around her body has been going on for about 3 weeks. Modifying factors include Benedryl which has helped with her symptoms. Denies shortness of breath or other signs of anaphylaxis. The original episode started in late September. Denies changes to body/skin care products, make-up or detergents. Possible trigger in restaurant, but unsure of what precise product.   Allergies  Allergen Reactions  . Methocarbamol     Altered thought process  . Penicillins   . Sulfonamide Derivatives   . Amoxicillin Rash     Current Outpatient Prescriptions on File Prior to Visit  Medication Sig Dispense Refill  . amLODipine (NORVASC) 5 MG tablet TAKE 1 TABLET DAILY. 30 tablet 10  . diphenhydrAMINE (BENADRYL) 25 MG tablet Take 2 tablets (50 mg total) by mouth every 6 (six) hours as needed (for itching or hives).    . furosemide (LASIX) 40 MG tablet Take 0.5 tablet (20 mg total) by mouth daily as directed. 15 tablet 11  . Multiple Vitamin (MULTIVITAMIN) capsule Take 1 capsule by mouth daily.      . OXYGEN-HELIUM IN Inhale 2 L into the lungs continuous.     No current facility-administered medications on file prior to visit.    Review of Systems  Constitutional: Negative for fever and chills.  Eyes:       Positive for right eye swelling.   Skin: Negative for rash.      Objective:    BP 144/64 mmHg   Pulse 76  Temp(Src) 97.7 F (36.5 C) (Oral)  Resp 18  Ht 4\' 11"  (1.499 m)  Wt 134 lb (60.782 kg)  BMI 27.05 kg/m2  SpO2 99% Nursing note and vital signs reviewed.  Physical Exam  Constitutional: She is oriented to person, place, and time. She appears well-developed and well-nourished. No distress.  Eyes: Conjunctivae and EOM are normal. Pupils are equal, round, and reactive to light. Lids are everted and swept, no foreign bodies found. Right eye exhibits no chemosis, no discharge, no exudate and no hordeolum. No foreign body present in the right eye.  Right eye swelling noted.  Cardiovascular: Normal rate, regular rhythm, normal heart sounds and intact distal pulses.   Pulmonary/Chest: Effort normal and breath sounds normal.  Neurological: She is alert and oriented to person, place, and time.  Skin: Skin is warm and dry.  Psychiatric: She has a normal mood and affect. Her behavior is normal. Judgment and thought content normal.       Assessment & Plan:   Problem List Items Addressed This Visit      Other   Angioedema - Primary    Symptoms and exam consistent with resolving angioedema. Start montelukast. Allergy referral in process. Written prescription for prednisone given if montelukast test does not reduce symptoms. Follow-up for any worsening symptoms or symptoms of anaphylaxis.      Relevant Medications   predniSONE (DELTASONE) 20  MG tablet   montelukast (SINGULAIR) 10 MG tablet

## 2015-03-30 NOTE — Progress Notes (Signed)
Pre visit review using our clinic review tool, if applicable. No additional management support is needed unless otherwise documented below in the visit note. 

## 2015-03-30 NOTE — Patient Instructions (Addendum)
Thank you for choosing [facility].  Summary/Instructions:  Your prescription(s) have been submitted to your pharmacy or been printed and provided for you. Please take as directed and contact our office if you believe you are having problem(s) with the medication(s) or have any questions.  If your symptoms worsen or fail to improve, please contact our office for further instruction, or in case of emergency go directly to the emergency room at the closest medical facility.   Follow up with allergy for allergy testing.  Start the prednisone as needed.

## 2015-03-30 NOTE — Telephone Encounter (Signed)
Spoke with pt, states she is having swelling in her arm spreading toward her face after touching a wet table in a restaurant.  Pt is having no breathing distress, fever, chest pain, noted fever.  I advised pt that she needed to call her PCP to further assess.  Pt expressed understanding.  Nothing further needed.

## 2015-04-04 DIAGNOSIS — Z1389 Encounter for screening for other disorder: Secondary | ICD-10-CM | POA: Diagnosis not present

## 2015-04-04 DIAGNOSIS — Z130 Encounter for screening for diseases of the blood and blood-forming organs and certain disorders involving the immune mechanism: Secondary | ICD-10-CM | POA: Diagnosis not present

## 2015-04-04 DIAGNOSIS — R3915 Urgency of urination: Secondary | ICD-10-CM | POA: Diagnosis not present

## 2015-04-04 DIAGNOSIS — Z01419 Encounter for gynecological examination (general) (routine) without abnormal findings: Secondary | ICD-10-CM | POA: Diagnosis not present

## 2015-04-04 DIAGNOSIS — Z6827 Body mass index (BMI) 27.0-27.9, adult: Secondary | ICD-10-CM | POA: Diagnosis not present

## 2015-04-04 DIAGNOSIS — Z1231 Encounter for screening mammogram for malignant neoplasm of breast: Secondary | ICD-10-CM | POA: Diagnosis not present

## 2015-04-12 ENCOUNTER — Encounter (HOSPITAL_BASED_OUTPATIENT_CLINIC_OR_DEPARTMENT_OTHER): Payer: Self-pay | Admitting: *Deleted

## 2015-04-12 ENCOUNTER — Emergency Department (HOSPITAL_BASED_OUTPATIENT_CLINIC_OR_DEPARTMENT_OTHER)
Admission: EM | Admit: 2015-04-12 | Discharge: 2015-04-12 | Disposition: A | Discharge status: Home / Self Care | Payer: Medicare Other | Attending: Emergency Medicine | Admitting: Emergency Medicine

## 2015-04-12 DIAGNOSIS — T7840XA Allergy, unspecified, initial encounter: Secondary | ICD-10-CM | POA: Insufficient documentation

## 2015-04-12 DIAGNOSIS — Y939 Activity, unspecified: Secondary | ICD-10-CM | POA: Diagnosis not present

## 2015-04-12 DIAGNOSIS — R05 Cough: Secondary | ICD-10-CM | POA: Diagnosis present

## 2015-04-12 DIAGNOSIS — Y999 Unspecified external cause status: Secondary | ICD-10-CM | POA: Diagnosis not present

## 2015-04-12 DIAGNOSIS — Z880 Allergy status to penicillin: Secondary | ICD-10-CM | POA: Diagnosis not present

## 2015-04-12 DIAGNOSIS — X58XXXA Exposure to other specified factors, initial encounter: Secondary | ICD-10-CM | POA: Insufficient documentation

## 2015-04-12 DIAGNOSIS — Z79899 Other long term (current) drug therapy: Secondary | ICD-10-CM | POA: Diagnosis not present

## 2015-04-12 DIAGNOSIS — Y929 Unspecified place or not applicable: Secondary | ICD-10-CM | POA: Diagnosis not present

## 2015-04-12 DIAGNOSIS — Z9981 Dependence on supplemental oxygen: Secondary | ICD-10-CM | POA: Insufficient documentation

## 2015-04-12 DIAGNOSIS — Z8679 Personal history of other diseases of the circulatory system: Secondary | ICD-10-CM | POA: Insufficient documentation

## 2015-04-12 DIAGNOSIS — Z7952 Long term (current) use of systemic steroids: Secondary | ICD-10-CM | POA: Insufficient documentation

## 2015-04-12 DIAGNOSIS — Z9114 Patient's other noncompliance with medication regimen: Secondary | ICD-10-CM | POA: Insufficient documentation

## 2015-04-12 MED ORDER — PREDNISONE 10 MG PO TABS: 20.0000 mg | ORAL_TABLET | Freq: Two times a day (BID) | ORAL | Status: DC

## 2015-04-12 MED ORDER — PREDNISONE 20 MG PO TABS
20.0000 mg | ORAL_TABLET | Freq: Once | ORAL | Status: AC
  Administered 2015-04-12: 20 mg via ORAL
  Filled 2015-04-12: qty 1

## 2015-04-12 NOTE — ED Notes (Signed)
She felt like her throat was closing an hour ago. She took Benadryl x 2 po and came here. She is on home oxygen. She is speaking in complete sentences. Ambulatory. No difficulty swallowing.

## 2015-04-12 NOTE — Discharge Instructions (Signed)
Prednisone as prescribed.  Benadryl 25 mg every 6 hours for the next 3 days.  Return to the ER if symptoms significantly worsen or change.   Allergies An allergy is an abnormal reaction to a substance by the body's defense system (immune system). Allergies can develop at any age. WHAT CAUSES ALLERGIES? An allergic reaction happens when the immune system mistakenly reacts to a normally harmless substance, called an allergen, as if it were harmful. The immune system releases antibodies to fight the substance. Antibodies eventually release a chemical called histamine into the bloodstream. The release of histamine is meant to protect the body from infection, but it also causes discomfort. An allergic reaction can be triggered by:  Eating an allergen.  Inhaling an allergen.  Touching an allergen. WHAT TYPES OF ALLERGIES ARE THERE? There are many types of allergies. Common types include:  Seasonal allergies. People with this type of allergy are usually allergic to substances that are only present during certain seasons, such as molds and pollens.  Food allergies.  Drug allergies.  Insect allergies.  Animal dander allergies. WHAT ARE SYMPTOMS OF ALLERGIES? Possible allergy symptoms include:  Swelling of the lips, face, tongue, mouth, or throat.  Sneezing, coughing, or wheezing.  Nasal congestion.  Tingling in the mouth.  Rash.  Itching.  Itchy, red, swollen areas of skin (hives).  Watery eyes.  Vomiting.  Diarrhea.  Dizziness.  Lightheadedness.  Fainting.  Trouble breathing or swallowing.  Chest tightness.  Rapid heartbeat. HOW ARE ALLERGIES DIAGNOSED? Allergies are diagnosed with a medical and family history and one or more of the following:  Skin tests.  Blood tests.  A food diary. A food diary is a record of all the foods and drinks you have in a day and of all the symptoms you experience.  The results of an elimination diet. An elimination diet  involves eliminating foods from your diet and then adding them back in one by one to find out if a certain food causes an allergic reaction. HOW ARE ALLERGIES TREATED? There is no cure for allergies, but allergic reactions can be treated with medicine. Severe reactions usually need to be treated at a hospital. HOW CAN REACTIONS BE PREVENTED? The best way to prevent an allergic reaction is by avoiding the substance you are allergic to. Allergy shots and medicines can also help prevent reactions in some cases. People with severe allergic reactions may be able to prevent a life-threatening reaction called anaphylaxis with a medicine given right after exposure to the allergen.   This information is not intended to replace advice given to you by your health care provider. Make sure you discuss any questions you have with your health care provider.   Document Released: 08/19/2002 Document Revised: 06/16/2014 Document Reviewed: 03/07/2014 Elsevier Interactive Patient Education [employer].

## 2015-04-12 NOTE — ED Provider Notes (Signed)
CSN: [id number]     Arrival date & time 04/12/15  1834 History  By signing my name below, I, Jolayne Panther, attest that this documentation has been prepared under the direction and in the presence of Veryl Speak, MD. Electronically Signed: Jolayne Panther, Scribe. 04/12/2015. 7:20 PM.    Chief Complaint  Patient presents with  . Allergic Reaction    The history is provided by the patient. No language interpreter was used.    HPI Comments: Julia Lucero is a 79 y.o. female who presents to the Emergency Department complaining of a gradually improving allergic reaction that started PTA. Pt reports symptoms started as tightness in her upper throat around her uvula followed by cough and sinus drainage. Symptoms started after eating lunch of a sandwich with sliced [place] meat and slices of cheese, which she hasn't had in a while. She took 2 Benadryl tablets over the counter with relief. Pt reports a history of similar symptoms over the last 5 weeks with no known trigger. She describes symptoms as facial swelling around her eyes or mouth which is intermittently accompanied by hives on her bilateral arms. Pt was seen on 10/21 by her PCP who prescribed Prednisone and Singulair, but pt has not been compliant with taking the medications. She has appointment with an allergist in February. Pt denies rash, hives and ear pain.   Past Medical History  Diagnosis Date  . Disorder of bone and cartilage, unspecified   . Chronic fatigue   . Rickettsia infection   . Restrictive lung disease   . Oxygen dependent   . Pulmonary hypertension [facility])    Past Surgical History  Procedure Laterality Date  . Oophorectomy     Family History  Problem Relation Age of Onset  . Diabetes Father   . Heart disease Father   . Hyperlipidemia Brother   . Arthritis Sister   . Colon cancer Mother   . Heart disease Mother   . Hypertension Mother   . Heart disease Maternal Grandfather   . Heart disease Paternal  Grandmother   . Heart disease Paternal Grandfather   . Hyperlipidemia Brother   . Hypertension Brother    Social History  Substance Use Topics  . Smoking status: Never Smoker   . Smokeless tobacco: Never Used  . Alcohol Use: 3.6 oz/week    6 Standard drinks or equivalent per week     Comment: Rarely   OB History    No data available     Review of Systems  Constitutional: Negative for fever and chills.  Respiratory: Positive for cough.        Tightness in throat  Skin: Negative for rash.       Denies hives   All other systems reviewed and are negative.  Allergies  Methocarbamol; Penicillins; Sulfonamide derivatives; and Amoxicillin  Home Medications   Prior to Admission medications   Medication Sig Start Date End Date Taking? Authorizing Provider  amLODipine (NORVASC) 5 MG tablet TAKE 1 TABLET DAILY. 07/27/13   Lorretta Harp, MD  diphenhydrAMINE (BENADRYL) 25 MG tablet Take 2 tablets (50 mg total) by mouth every 6 (six) hours as needed (for itching or hives). 03/12/15   John Molpus, MD  furosemide (LASIX) 40 MG tablet Take 0.5 tablet (20 mg total) by mouth daily as directed. 08/07/14   Lorretta Harp, MD  montelukast (SINGULAIR) 10 MG tablet Take 1 tablet (10 mg total) by mouth at bedtime. 03/30/15   Golden Circle, FNP  Multiple Vitamin (MULTIVITAMIN) capsule Take 1 capsule by mouth daily.      Historical Provider, MD  OXYGEN-HELIUM IN Inhale 2 L into the lungs continuous.    Historical Provider, MD  predniSONE (DELTASONE) 20 MG tablet Take 1 tablet (20 mg total) by mouth 2 (two) times daily with a meal. 03/30/15   Golden Circle, FNP   BP 141/71 mmHg  Pulse 86  Temp(Src) 98 F (36.7 C) (Oral)  Resp 20  Ht 4\' 11"  (1.499 m)  Wt 134 lb (60.782 kg)  BMI 27.05 kg/m2  SpO2 100% Physical Exam  Constitutional: She is oriented to person, place, and time. She appears well-developed and well-nourished. No distress.  HENT:  Head: Normocephalic.  Mouth/Throat:  Oropharynx is clear and moist. No oropharyngeal exudate.  Eyes: Conjunctivae are normal.  Neck: Normal range of motion. Neck supple. No thyromegaly present.  Cardiovascular: Normal rate, regular rhythm and normal heart sounds.   Pulmonary/Chest: Effort normal and breath sounds normal. No stridor. No respiratory distress. She has no wheezes.  Abdominal: She exhibits no distension.  Lymphadenopathy:    She has no cervical adenopathy.  Neurological: She is alert and oriented to person, place, and time.  Skin: Skin is warm and dry.  Psychiatric: She has a normal mood and affect.  Nursing note and vitals reviewed.   ED Course  Procedures  DIAGNOSTIC STUDIES:    Oxygen Saturation is 100% on RA, normal by my interpretation.   COORDINATION OF CARE:  6:52 PM Will monitor in the ED. Will administer a small dose of Prednisone. Discussed treatment plan with pt at bedside and pt agreed to plan.   Labs Review Labs Reviewed - No data to display  Imaging Review No results found.   EKG Interpretation None      MDM   Final diagnoses:  None    Patient presents with complaints of throat swelling. She has had this recurrently for the past several months. I see no obvious abnormality and appreciate no stridor. She was given prednisone here and took Benadryl at home prior to coming here. She is now feeling better and I believe is appropriate for discharge.  I personally performed the services described in this documentation, which was scribed in my presence. The recorded information has been reviewed and is accurate.         Veryl Speak, MD 04/12/15 2049

## 2015-05-14 ENCOUNTER — Other Ambulatory Visit: Payer: Self-pay | Admitting: Cardiovascular Disease

## 2015-05-14 NOTE — Telephone Encounter (Signed)
Rx request sent to pharmacy.  

## 2015-05-16 ENCOUNTER — Other Ambulatory Visit: Payer: Self-pay | Admitting: Cardiovascular Disease

## 2015-05-16 NOTE — Telephone Encounter (Signed)
REFILL 

## 2015-05-28 ENCOUNTER — Ambulatory Visit (INDEPENDENT_AMBULATORY_CARE_PROVIDER_SITE_OTHER): Payer: Medicare Other

## 2015-05-28 DIAGNOSIS — Z23 Encounter for immunization: Secondary | ICD-10-CM | POA: Diagnosis not present

## 2015-06-06 ENCOUNTER — Encounter: Payer: Self-pay | Admitting: *Deleted

## 2015-06-27 DIAGNOSIS — H2513 Age-related nuclear cataract, bilateral: Secondary | ICD-10-CM | POA: Diagnosis not present

## 2015-06-27 DIAGNOSIS — H35033 Hypertensive retinopathy, bilateral: Secondary | ICD-10-CM | POA: Diagnosis not present

## 2015-06-27 DIAGNOSIS — H43813 Vitreous degeneration, bilateral: Secondary | ICD-10-CM | POA: Diagnosis not present

## 2015-07-23 ENCOUNTER — Encounter: Payer: Self-pay | Admitting: Internal Medicine

## 2015-07-23 ENCOUNTER — Ambulatory Visit (INDEPENDENT_AMBULATORY_CARE_PROVIDER_SITE_OTHER): Payer: Medicare Other | Admitting: Internal Medicine

## 2015-07-23 VITALS — BP 116/78 | HR 82 | Ht 59.0 in | Wt 132.4 lb

## 2015-07-23 DIAGNOSIS — J309 Allergic rhinitis, unspecified: Secondary | ICD-10-CM

## 2015-07-23 DIAGNOSIS — J302 Other seasonal allergic rhinitis: Secondary | ICD-10-CM

## 2015-07-23 DIAGNOSIS — T783XXD Angioneurotic edema, subsequent encounter: Secondary | ICD-10-CM

## 2015-07-23 DIAGNOSIS — J3089 Other allergic rhinitis: Secondary | ICD-10-CM

## 2015-07-23 NOTE — Patient Instructions (Signed)
Since you have not had problems in almost a year, we agreed not to do anything now.  Ok to take an antihistamine if needed for allergic problems.

## 2015-07-23 NOTE — Progress Notes (Signed)
07/23/2015-80 year old female never smoker Referral by Dr Jason Fila and Mauricio Po. Pt states she is having trouble with foods-had swelling in her upper lip after eating hot sauce; proceeded to have tongue swelling after EMS was called.  This happened almost a year ago with no recurrence since. Some awareness of spring and fall pollen rhinitis late February (trees), June, November. She manages nasal complaints with occasional antihistamine. Followed at this office for pulmonary hypertension with dyspnea complicated by cor pulmonale, kyphosis Remote episode of angioedema may have been associated with ampicillin decades ago. She has not recognized unusual reactions to foods, insect stings, aspirin, latex.  Prior to Admission medications   Medication Sig Start Date End Date Taking? Authorizing Provider  amLODipine (NORVASC) 5 MG tablet TAKE 1 TABLET BY MOUTH DAILY 05/16/15  Yes Lorretta Harp, MD  diphenhydrAMINE (BENADRYL) 25 MG tablet Take 2 tablets (50 mg total) by mouth every 6 (six) hours as needed (for itching or hives). 03/12/15  Yes John Molpus, MD  furosemide (LASIX) 40 MG tablet TAKE 1/2 TABLET BY MOUTH EVERY DAY 05/16/15  Yes Lorretta Harp, MD  Multiple Vitamin (MULTIVITAMIN) capsule Take 1 capsule by mouth daily.     Yes Historical Provider, MD  OXYGEN-HELIUM IN Inhale 2 L into the lungs continuous.   Yes Historical Provider, MD   Past Medical History  Diagnosis Date  . Disorder of bone and cartilage, unspecified   . Chronic fatigue   . Rickettsia infection   . Restrictive lung disease   . Oxygen dependent   . Pulmonary hypertension [facility])    Past Surgical History  Procedure Laterality Date  . Oophorectomy     Family History  Problem Relation Age of Onset  . Diabetes Father   . Heart disease Father   . Hyperlipidemia Brother   . Arthritis Sister   . Colon cancer Mother   . Heart disease Mother   . Hypertension Mother   . Heart disease Maternal Grandfather   . Heart  disease Paternal Grandmother   . Heart disease Paternal Grandfather   . Hyperlipidemia Brother   . Hypertension Brother    Social History   Social History  . Marital Status: Married    Spouse Name: N/A  . Number of Children: 2  . Years of Education: 18   Occupational History  . Not on file.   Social History Main Topics  . Smoking status: Never Smoker   . Smokeless tobacco: Never Used  . Alcohol Use: 3.6 oz/week    6 Standard drinks or equivalent per week     Comment: Rarely  . Drug Use: No  . Sexual Activity: Not on file   Other Topics Concern  . Not on file   Social History Narrative   Fun: Read, exercise, keep up with family   Denies abuse and feels safe at home.    ROS-see HPI   Negative unless "+" Constitutional:    weight loss, night sweats, fevers, chills, fatigue, lassitude. HEENT:    headaches, difficulty swallowing, tooth/dental problems, sore throat,       sneezing, itching, ear ache, + nasal congestion, + post nasal drip, snoring CV:    chest pain, orthopnea, PND, swelling in lower extremities, anasarca,  dizziness, palpitations Resp:   shortness of breath with exertion or at rest.                productive cough,   non-productive cough, coughing up of blood.              change in color of mucus.  wheezing.   Skin:    rash or lesions. GI:  No-   heartburn, indigestion, abdominal pain, nausea, vomiting, diarrhea,                 change in bowel habits, loss of appetite GU: dysuria, change in color of urine, no urgency or frequency.   flank pain. MS:   joint pain, stiffness, decreased range of motion, back pain. Neuro-     nothing unusual Psych:  change in mood or affect.  depression or anxiety.   memory loss.  OBJ- Physical Exam General- Alert, Oriented, Affect-appropriate, Distress- none acute, + nasal oxygen portable Skin- rash-none, lesions- none, excoriation- none Lymphadenopathy- none Head-  atraumatic            Eyes- Gross vision intact, PERRLA, conjunctivae and secretions clear            Ears- Hearing, canals-normal            Nose- Clear, no-Septal dev, mucus, polyps, erosion, perforation             Throat- Mallampati II , mucosa clear , drainage- none, tonsils- atrophic Neck- flexible , trachea midline, no stridor , thyroid nl, carotid no bruit Chest - symmetrical excursion , unlabored           Heart/CV- RRR , no murmur , no gallop  , no rub, nl s1 s2                           - JVD- none , edema- none, stasis changes- none, varices- none           Lung- clear to P&A, wheeze- none, cough- none , dullness-none, rub- none           Chest wall-  Abd-  Br/ Gen/ Rectal- Not done, not indicated Extrem- cyanosis- none, clubbing, none, atrophy- none, strength- nl Neuro- grossly intact to observation

## 2015-07-24 DIAGNOSIS — J3089 Other allergic rhinitis: Secondary | ICD-10-CM

## 2015-07-24 DIAGNOSIS — J302 Other seasonal allergic rhinitis: Secondary | ICD-10-CM | POA: Insufficient documentation

## 2015-07-24 NOTE — Assessment & Plan Note (Signed)
She minimizes symptoms consistent with pollen-induced allergic rhinitis We discussed basic environmental precautions for pollen and dust mite in the home and recommended initial use of a nonsedating OTC antihistamine if needed. She will watch as spring pollen season begins this year and return if needed.

## 2015-07-24 NOTE — Assessment & Plan Note (Signed)
She says this was a single episode happening almost a year ago with no recurrence. No definite trigger identified. She is not concerned enough to have any allergy evaluation testing done at this visit.

## 2015-08-20 ENCOUNTER — Other Ambulatory Visit: Payer: Self-pay | Admitting: Cardiovascular Disease

## 2015-08-20 NOTE — Telephone Encounter (Signed)
REFILL 

## 2015-09-09 ENCOUNTER — Other Ambulatory Visit: Payer: Self-pay | Admitting: Cardiovascular Disease

## 2015-09-10 ENCOUNTER — Ambulatory Visit (INDEPENDENT_AMBULATORY_CARE_PROVIDER_SITE_OTHER): Payer: Medicare Other | Admitting: Internal Medicine

## 2015-09-10 ENCOUNTER — Encounter: Payer: Self-pay | Admitting: Internal Medicine

## 2015-09-10 VITALS — BP 134/62 | HR 73 | Ht 59.0 in | Wt 132.2 lb

## 2015-09-10 DIAGNOSIS — Q676 Pectus excavatum: Secondary | ICD-10-CM | POA: Diagnosis not present

## 2015-09-10 DIAGNOSIS — R0689 Other abnormalities of breathing: Secondary | ICD-10-CM | POA: Diagnosis not present

## 2015-09-10 DIAGNOSIS — R0600 Dyspnea, unspecified: Secondary | ICD-10-CM | POA: Diagnosis not present

## 2015-09-10 NOTE — Progress Notes (Signed)
Subjective:     Patient ID: Julia Lucero, female   DOB: [birth year], 80 y.o.   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 Decompensated Cor Pulmonale and discovery of pulmonary hypertension on ECHO (no right heart cath) following trip to [facility] in March 2011 due to pneumonia (CT chest  march 2011 at [place] consolidation and bialteral effusion). Negative Autoimmune Profile May 2011 except for mild raised CRP and borderline positive rheumatoid factor   March 04, 2010: feels well. Doing rehab. Not desaturating there. Due to fly to [facility] 10/20/2011on [employer]. Lot of questions on  flight o2. WE walked her today 185 feet x 3 laps and lowest o2 was only 92%. This is a huge improvement because in MAy 2011 she did desaturate with exertion. No new complaints. She continues on lasix and amlodipine started by Dr. Gwenlyn Found  in Apri 2011. Compliant with medicines. No interim problems. Denies edema, cough, orthopnea, paroxysmal nocturnal dyspnea, wheeze. She has seen Dr. Gwenlyn Found in interim and per his records he has resasured her and is following expectantly. REC: EXPECTANT FOLLOWUP  OV 11/01/2010: OVerall well. Has completed rehab. Underwent PT for muscle strengthening but now doing it on own. Unclear if has increasing dyspnea or not. AT times she denies it but at times she states she is marginally worse. Certainly seems to get tired by end of the day which might be baseline. Uses o2 wwiht exertion.  Continues lasix and amlodipine. Not seen Dr. Gwenlyn Found in interim either. No worsening edema. No wheeze. No chest pain. No syncope. No weight loss. No pnd. No orthopnea. Had 3 flight trips in interim and did well. Has repeat question on mechanism of restrictive chest disease and pulmonary hypertension. Walking desaturatin test today - and dropped to 88% - 87% after 2 laps  REC  Please continue daily exercise - make sure  heart rate is < 120-125 - you can take your heart rate that high but not higher  Make sure pulse ox is always >88% with or without oxygen  Use oxygen with exercise and walking and moving around  We will set up overnight oxygen study on room air to determine your oxygen need at night  Please see Dr. Gwenlyn Found as well  REturn to see me in 6 months  Come sooner if there are problems  OV 08/04/2011  OVerall well. Doing exercises 5-6 times per week in 2 sessions daily. Really helping. Overall stable. Class 2 dyspnea with exertion that is stable and relieved by rest. Uses o2 wwiht exertion.  Continues lasix and amlodipine. Not seen Dr. Gwenlyn Found in interim either. No worsening edema. No wheeze. No chest pain. No syncope. No weight loss. No pnd. No orthopnea. Had several flight trips in interim to [place] and did well; does DVT precaution exercises. Walking desaturatin test today - and dropped to 88% - 87% after 1 laps x 185 feet. The exertional desaturation is marginally worse than last OV. We will keep an eye on this because she is otherwise well  Past, Family, Social reviewed: no change since last visit   Please continue daily exercise - make sure heart rate is < 120-125 - you can take your heart rate that high but not higher  Make sure pulse ox is always >88% with or without oxygen  Use oxygen with exercise and walking and moving around  REturn to see me in 8 Months. Wil monitor her exertional desaturations  Come sooner  if there are problems   OV 03/30/2012 Resp wise stable. Says she feels hollow inside. Unable to explain. Spiritually weary. No other iesues. Walking desaturation test on 03/30/2012 185 feet x 1 laps:  did desaturate. Rest pulse ox was 94%, final pulse ox was 88%. HR response 93/min at rest to 114/min at peak exertion at 1 laps. The distance to desaturation is similar to Feb 2013 but worse since May 2012  [facility] Ensure heart ok with Dr Gwenlyn Found   10/20/2012 Office Visit  followup  related  restrictive cage defect related to rickets   - Last seen in December 2013. After that for my recommendation she did visit cardiology Dr. Gwenlyn Found. She and husband recollect having an echocardiogram that is reported as normal per her history. Overall she's stable although she feels that over the span of 2 years she's is a little bit more fatigued than baseline especially early in the morning. She's not sure if she's had her vitamin D and thyroid function tests checked. Also of note, she went to [place] by car and return a few weeks ago. She did take several breaks every few hours and stretch the legs. Upon return she had mild pedal edema that lasted 2 days and then resolved spontaneously. There was no warmth of the leg or hemoptysis or chest pain or undue dyspnea. It is noted that she does have bilateral varicose veins. No other health issues  - Walk test today 10/20/2012: 185 feet x3 laps on room air: Pulse oximetry 86% at the third lap with a heart rate of 107   Past, Family, Social reviewed: no change since last visit    #Respiratory  - clinically you are stable with good oxygen levels for 550 feet of exertion  - suspect some deconditioning over winter; so will re-refer to pulmonary rehabilitation - continue current care  #Fatigue - talk to ARONSON,RICHARD A, MD about checking vitamin D and thyroid levels  #Followup - 9 months or sooner if needed  OV 09/29/2013  Chief Complaint  Patient presents with  . Follow-up    pt c/o SOB with exertion.     followup  related restrictive cage defect related to rickets with associated pulmonary hypertension  - Last seen in May 2014. Since then overall stable. Last visit she only desaturated on room air after walking 185 feet x3 laps. However, she continues to use 2 L nasal cannula are 24 hours. She's feels that she needs this. But otherwise he does be that she's not any worse or any better. She does say that her travel plans are unchanged but as she  ages she finds that it's more complicated to organize do to dependency her oxygen. Is no worsening edema or chest pain or cough or sputum production.  - Walk test on room air her and made her feet x3 laps: At rest her heart rate was 76 per minute. Pulse ox is 96 a minute. After exertion 185 feet x1 lap she desaturated down to 87% and she stayed down at 87%. Peak heart rate was 101. Therefore exercise was terminated. This is similar to ? Worse than a year ago  - She is somewhat anxious about her long term future. We discussed [place] referral to pulm htn clinic and she is open to doing that  Social history: Husband suffered is herniation recently and is on conservative treatment     OV 09/08/2014  Chief Complaint  Patient presents with  . Follow-up    Pt stated she  was feeling well until catching cold. Pt c/o sinus congestion and cough. Pt currently taking levaquin. Pt c/o dry cough. Pt denies increase in SOB and CP/tightness.     Annual follow-up childhood rickets associated with thoracic cage defect associated with clinical pulmonary hypertension /cor pulmolane based on echo. Clinically she suffers from exertional hypoxemia.   I have been following Julia Lucero for many years. The last 1 year she reports has been stable. In the interim in summer 2015 she did go to [facility] pulmonary hypertension program. According to her history she was reassured that problems are related to thoracic cage defect and she was discharged from follow-up She does not recollect having had a right heart catheterization though chart review at [facility] suggest that she had it but I'm unable to access these results. She did have a VQ scan which was determined is a low probability for pulmonary embolism. She did have a chest x-ray that showed enlarged cardiac silhouette along with pectus excavatum. And also thoracic scoliosis. Currently overall she is doing well but a week ago she did pick up a cold and she is on a ten-day  Levaquin. Today Walking desaturation test 185 feet 3 laps on room air: She walked all 3 laps and at the very end dropped to 88%. This is probably better than a year ago   of note, exam shows bibasal crackles which I believe might be new for her    OV 09/10/2015  Chief Complaint  Patient presents with  . Follow-up    Pt here for 1 year f/u. Pt states her breathing is doing well and she states she feels stronger. Pt denies cough, wheezing, CP/tightness, and swelling.     Annual follow-up childhood rickets associated with thoracic cage defect associated with clinical pulmonary hypertension /cor pulmolane based on echo. Clinically she suffers from exertional hypoxemia.    This is an annual follow-up. Last seen April 2016. In the interim she developed tongue swelling or lip swelling with hot sauce. She saw Dr. Annamaria Boots for this. She's been reassured. Overall she's feeling well. She has been to [place] bunch of times to see the grandchildren. Most recent trip was a few weeks ago. She feels stronger. She has less exertional dyspnea than a year ago. She does aerobic work associated with light weight training of her upper extremities. There are no new issues. Walking desaturation test 185 feet 3 laps on room air: 1 lap and desat to84% (baseine 2 years ago is 87%)  So stable  I did do high resolution CT chest April 2016 for crackles on her chest exam but was clear without any interstitial lung disease.    has a past medical history of Disorder of bone and cartilage, unspecified; Chronic fatigue; Rickettsia infection; Restrictive lung disease; Oxygen dependent; and Pulmonary hypertension ([place]).   reports that she has never smoked. She has never used smokeless tobacco.  Past Surgical History  Procedure Laterality Date  . Oophorectomy      Allergies  Allergen Reactions  . Methocarbamol     Altered thought process  . Penicillins   . Sulfonamide Derivatives   . Amoxicillin Rash     Immunization History  Administered Date(s) Administered  . Influenza Split 03/12/2011, 03/30/2012  . Influenza Whole 03/04/2010  . Influenza, Seasonal, Injecte, Preservative Fre 04/26/2014  . Influenza,inj,Quad PF,36+ Mos 03/09/2013, 05/28/2015  . Pneumococcal Polysaccharide-23 06/10/2007    Family History  Problem Relation Age of Onset  . Diabetes Father   . Heart  disease Father   . Hyperlipidemia Brother   . Arthritis Sister   . Colon cancer Mother   . Heart disease Mother   . Hypertension Mother   . Heart disease Maternal Grandfather   . Heart disease Paternal Grandmother   . Heart disease Paternal Grandfather   . Hyperlipidemia Brother   . Hypertension Brother      Current outpatient prescriptions:  .  amLODipine (NORVASC) 5 MG tablet, Take 1 tablet (5 mg total) by mouth daily. NEED OV., Disp: 90 tablet, Rfl: 0 .  furosemide (LASIX) 40 MG tablet, TAKE 1/2 TABLET BY MOUTH EVERY DAY, Disp: 45 tablet, Rfl: 0 .  Multiple Vitamin (MULTIVITAMIN) capsule, Take 1 capsule by mouth daily.  , Disp: , Rfl:  .  OXYGEN-HELIUM IN, Inhale 2 L into the lungs continuous., Disp: , Rfl:     Review of Systems     Objective:   Physical Exam Filed Vitals:   09/10/15 1458  BP: 134/62  Pulse: 73  Height: 4\' 11"  (1.499 m)  Weight: 132 lb 3.2 oz (59.966 kg)  SpO2: 98%   Estimated body mass index is 26.69 kg/(m^2) as calculated from the following:   Height as of this encounter: 4\' 11"  (1.499 m).   Weight as of this encounter: 132 lb 3.2 oz (59.966 kg).  General exam: Pleasant female. Respiratory exam: Scattered mild crackles at the bases. This is baseline. Cardiovascular: Normal heart sounds Chest: Pectus excavatum. Abdomen: Soft nontender Extremity: No cyanosis no clubbing no edema     Assessment:       ICD-9-CM ICD-10-CM   1. Pectus excavatum 754.81 Q67.6   2. Dyspnea and respiratory abnormality 786.09 R06.00     R06.89        Plan:      Annual follow-up  childhood rickets associated with thoracic cage defect associated with clinical pulmonary hypertension /cor pulmolane based on echo. Clinically she suffers from exertional hypoxemia.  Overall stable. ROV 1 year. Continue o2  Dr. Brand Males, M.D., [facility].C.P Pulmonary and Critical Care Medicine Staff Physician [place] [facility] Pager: [phone], If no answer or between  15:00h - 7:00h: call [phone]  09/10/2015 3:35 PM

## 2015-09-10 NOTE — Patient Instructions (Addendum)
ICD-9-CM ICD-10-CM   1. Pectus excavatum 754.81 Q67.6   2. Dyspnea and respiratory abnormality 786.09 R06.00     R06.89    Overall stable clinically  Plan Oxygen use as before  Followup 1 year or sooner if needed

## 2015-11-20 ENCOUNTER — Other Ambulatory Visit: Payer: Self-pay | Admitting: Cardiovascular Disease

## 2015-11-20 NOTE — Telephone Encounter (Signed)
Rx Refill

## 2015-12-06 DIAGNOSIS — R102 Pelvic and perineal pain: Secondary | ICD-10-CM | POA: Diagnosis not present

## 2015-12-07 ENCOUNTER — Other Ambulatory Visit: Payer: Self-pay | Admitting: Cardiovascular Disease

## 2016-01-30 DIAGNOSIS — R1031 Right lower quadrant pain: Secondary | ICD-10-CM | POA: Diagnosis not present

## 2016-02-25 ENCOUNTER — Other Ambulatory Visit: Payer: Self-pay | Admitting: Cardiovascular Disease

## 2016-03-12 ENCOUNTER — Other Ambulatory Visit: Payer: Self-pay | Admitting: Cardiovascular Disease

## 2016-03-14 ENCOUNTER — Ambulatory Visit (INDEPENDENT_AMBULATORY_CARE_PROVIDER_SITE_OTHER): Payer: Medicare Other

## 2016-03-14 DIAGNOSIS — Z23 Encounter for immunization: Secondary | ICD-10-CM | POA: Diagnosis not present

## 2016-04-10 DIAGNOSIS — Z01419 Encounter for gynecological examination (general) (routine) without abnormal findings: Secondary | ICD-10-CM | POA: Diagnosis not present

## 2016-04-10 DIAGNOSIS — Z1231 Encounter for screening mammogram for malignant neoplasm of breast: Secondary | ICD-10-CM | POA: Diagnosis not present

## 2016-04-16 ENCOUNTER — Telehealth: Payer: Self-pay | Admitting: Internal Medicine

## 2016-04-16 DIAGNOSIS — R0689 Other abnormalities of breathing: Secondary | ICD-10-CM

## 2016-04-16 DIAGNOSIS — I272 Pulmonary hypertension, unspecified: Secondary | ICD-10-CM

## 2016-04-16 DIAGNOSIS — R0600 Dyspnea, unspecified: Secondary | ICD-10-CM

## 2016-04-16 NOTE — Telephone Encounter (Signed)
Spoke with pt's spouse (dpr on file), states that they are going to [place] for approx 2 weeks on 04/25/2016, would like to pick up 02 tanks from [facility] in [place] to use while they are out there.  Pt's spouse has already spoken to [facility], who states they just need an order to supply this.    Order placed.  Nothing further needed at this time.

## 2016-04-17 ENCOUNTER — Ambulatory Visit (INDEPENDENT_AMBULATORY_CARE_PROVIDER_SITE_OTHER): Payer: Medicare Other

## 2016-04-17 DIAGNOSIS — Z23 Encounter for immunization: Secondary | ICD-10-CM

## 2016-05-09 ENCOUNTER — Telehealth: Payer: Self-pay | Admitting: Family

## 2016-05-09 NOTE — Telephone Encounter (Signed)
Attempted to call Mrs. Julia Lucero to schedule awv appt. Pt did not answer and no vm set up. Will try to call pt back to schedule appt.

## 2016-05-16 ENCOUNTER — Other Ambulatory Visit: Payer: Self-pay

## 2016-06-04 ENCOUNTER — Other Ambulatory Visit: Payer: Self-pay | Admitting: *Deleted

## 2016-06-04 ENCOUNTER — Other Ambulatory Visit: Payer: Self-pay | Admitting: Cardiovascular Disease

## 2016-06-04 MED ORDER — AMLODIPINE BESYLATE 5 MG PO TABS: ORAL_TABLET | ORAL | 0 refills | Status: DC

## 2016-06-04 MED ORDER — FUROSEMIDE 40 MG PO TABS: 20.0000 mg | ORAL_TABLET | Freq: Every day | ORAL | 0 refills | Status: DC

## 2016-06-10 ENCOUNTER — Other Ambulatory Visit: Payer: Self-pay

## 2016-06-10 MED ORDER — AMLODIPINE BESYLATE 5 MG PO TABS: ORAL_TABLET | ORAL | 0 refills | Status: DC

## 2016-06-10 MED ORDER — FUROSEMIDE 40 MG PO TABS: 20.0000 mg | ORAL_TABLET | Freq: Every day | ORAL | 0 refills | Status: DC

## 2016-06-13 ENCOUNTER — Telehealth: Payer: Self-pay | Admitting: Internal Medicine

## 2016-06-13 NOTE — Telephone Encounter (Signed)
Spoke with Bethanne Ginger and she stated to disregard her previous message. Nothing further is needed at this time

## 2016-07-07 NOTE — Progress Notes (Signed)
Pre visit review using our clinic review tool, if applicable. No additional management support is needed unless otherwise documented below in the visit note. 

## 2016-07-07 NOTE — Progress Notes (Signed)
Subjective:   Julia Lucero is a 81 y.o. female who presents for an Initial Medicare Annual Wellness Visit.  Review of Systems    No ROS.  Medicare Wellness Visit.   Cardiac Risk Factors include: advanced age (>72men, >52 women);hypertension Sleep patterns: Sleeps 7 hours per night. Wakes up once per night and goes back to sleep. Gets up once to go to the bathroom. Home Safety/Smoke Alarms:  Feels safe in home. Smoke alarms in place.  Living environment; residence and Firearm Safety: 2-story house, no firearms. Lives with husband Seat Belt Safety/Bike Helmet: Wears seat belt.   Counseling:   Eye Exam- last within the last 2 years. Pt. Will make an appnt. soon Dental- Every  6 months Dr. Delphia Grates  Female:   Pap-  Patient states within the last year     Mammo-   Patient states within the last year    Dexa scan-   Patient  States within last 5 yeras   [facility]- Last 03/20/09: No result on file. Recall 5 years.   Patient poor historian of where screenings took place. Will attempt to retrieve records.     Objective:    Today's Vitals   07/09/16 1117  BP: (!) 123/50  Pulse: 85  SpO2: 99%  Weight: 134 lb (60.8 kg)  Height: 4\' 11"  (1.499 m)   Body mass index is 27.06 kg/m.   Current Medications (verified) Outpatient Encounter Prescriptions as of 07/09/2016  Medication Sig  . amLODipine (NORVASC) 5 MG tablet TAKE 1 TABLET(5 MG) BY MOUTH DAILY  . furosemide (LASIX) 40 MG tablet Take 0.5 tablets (20 mg total) by mouth daily.  . Multiple Vitamin (MULTIVITAMIN) capsule Take 1 capsule by mouth daily.    . OXYGEN-HELIUM IN Inhale 2 L into the lungs continuous.   No facility-administered encounter medications on file as of 07/09/2016.     Allergies (verified) Methocarbamol; Penicillins; Sulfonamide derivatives; and Amoxicillin   History: Past Medical History:  Diagnosis Date  . Chronic fatigue   . Disorder of bone and cartilage, unspecified   . Oxygen dependent   .  Pulmonary hypertension   . Restrictive lung disease   . Rickettsia infection    Past Surgical History:  Procedure Laterality Date  . OOPHORECTOMY     Family History  Problem Relation Age of Onset  . Diabetes Father   . Heart disease Father   . Hyperlipidemia Brother   . Arthritis Sister   . Colon cancer Mother   . Heart disease Mother   . Hypertension Mother   . Heart disease Maternal Grandfather   . Heart disease Paternal Grandmother   . Heart disease Paternal Grandfather   . Hyperlipidemia Brother   . Hypertension Brother    Social History   Occupational History  . Not on file.   Social History Main Topics  . Smoking status: Never Smoker  . Smokeless tobacco: Never Used  . Alcohol use 3.6 oz/week    6 Standard drinks or equivalent per week     Comment: Rarely  . Drug use: No  . Sexual activity: No    Tobacco Counseling Counseling given: Not Answered   Activities of Daily Living In your present state of health, do you have any difficulty performing the following activities: 07/09/2016  Hearing? N  Vision? N  Difficulty concentrating or making decisions? N  Walking or climbing stairs? N  Dressing or bathing? N  Doing errands, shopping? N  Preparing Food and eating ? N  Using the Toilet? N  In the past six months, have you accidently leaked urine? N  Do you have problems with loss of bowel control? N  Managing your Medications? N  Managing your Finances? N  Housekeeping or managing your Housekeeping? N  Some recent data might be hidden    Immunizations and Health Maintenance Immunization History  Administered Date(s) Administered  . Influenza Split 03/12/2011, 03/30/2012  . Influenza Whole 03/04/2010  . Influenza, High Dose Seasonal PF 04/17/2016  . Influenza, Seasonal, Injecte, Preservative Fre 04/26/2014  . Influenza,inj,Quad PF,36+ Mos 03/09/2013, 05/28/2015  . Pneumococcal Conjugate-13 03/14/2016  . Pneumococcal Polysaccharide-23 06/10/2007    Health Maintenance Due  Topic Date Due  . DEXA SCAN  08/03/2002    Patient Care Team: Golden Circle, FNP as PCP - General (Family Medicine) Brand Males, MD as Consulting Physician (Pulmonary Disease) Danella Sensing, MD as Consulting Physician (Dermatology)  Indicate any recent Medical Services you may have received from other than [facility] providers in the past year (date may be approximate).     Assessment:   This is a routine wellness examination for Julia Lucero. No ROS.  Medicare Wellness Visit.   Hearing/Vision screen Hearing Screening Comments: Able to hear conversational tones w/o difficulty. No issues reported. Passes Whisper test  Vision Screening Comments: Wears glasses, states she is beginning to develop cartaracts  Dietary issues and exercise activities discussed: Current Exercise Habits: Home exercise routine, Type of exercise: walking, Time (Minutes): 20, Frequency (Times/Week): 3, Weekly Exercise (Minutes/Week): 60, Intensity: Moderate, Exercise limited by: respiratory conditions(s)   Diet (meal preparation, eat out, water intake, caffeinated beverages, dairy products, fruits and vegetables): 4 cups of water per day. Hot tea x2 daily Breakfast: toast and tea, omelete, oatmeal Lunch: sandwich, tuna, chips, apple Dinner: meat, tacos, vegetable, green beans, salad   Discussed increase water intake Goals    . maintain current health status          Wants to continue to exercise routine.       Depression Screen PHQ 2/9 Scores 07/09/2016 12/15/2012  PHQ - 2 Score 0 0    Fall Risk Fall Risk  07/09/2016 05/16/2016 12/15/2012  Falls in the past year? No No No    Cognitive Function: MMSE - Mini Mental State Exam 07/09/2016  Orientation to time 5  Orientation to Place 5  Registration 3  Attention/ Calculation 5  Recall 3  Language- name 2 objects 2  Language- repeat 1  Language- follow 3 step command 3  Language- read & follow direction 1  Write a sentence 1   Copy design 1  Total score 30        Screening Tests Health Maintenance  Topic Date Due  . DEXA SCAN  08/03/2002  . ZOSTAVAX  07/09/2017 (Originally 08/03/1997)  . TETANUS/TDAP  07/09/2017 (Originally 08/03/1954)  . INFLUENZA VACCINE  Completed  . PNA vac Low Risk Adult  Completed      Plan:    Continue to eat heart healthy diet (full of fruits, vegetables, whole grains, lean protein, water--limit salt, fat, and sugar intake) and increase physical activity as tolerated.  Continue doing brain stimulating activities (puzzles, reading, adult coloring books, staying active) to keep memory sharp.    During the course of the visit, Julia Lucero was educated and counseled about the following appropriate screening and preventive services:   Vaccines to include Pneumoccal, Influenza, Hepatitis B, Td, Zostavax, HCV  Cardiovascular disease screening  Colorectal cancer screening  Bone density screening  Diabetes  screening  Glaucoma screening  Mammography/PAP  Nutrition counseling  Patient Instructions (the written plan) were given to the patient.    Michiel Cowboy, RN   07/09/2016

## 2016-07-09 ENCOUNTER — Ambulatory Visit (INDEPENDENT_AMBULATORY_CARE_PROVIDER_SITE_OTHER): Payer: Medicare Other | Admitting: *Deleted

## 2016-07-09 VITALS — BP 123/50 | HR 85 | Ht 59.0 in | Wt 134.0 lb

## 2016-07-09 DIAGNOSIS — Z0000 Encounter for general adult medical examination without abnormal findings: Secondary | ICD-10-CM

## 2016-07-09 NOTE — Patient Instructions (Addendum)
Continue to eat heart healthy diet (full of fruits, vegetables, whole grains, lean protein, water--limit salt, fat, and sugar intake) and increase physical activity as tolerated.  Continue doing brain stimulating activities (puzzles, reading, adult coloring books, staying active) to keep memory sharp.   Bone Densitometry Introduction Bone densitometry is an imaging test that uses a special X-ray to measure the amount of calcium and other minerals in your bones (bone density). This test is also known as a bone mineral density test or dual-energy X-ray absorptiometry (DXA). The test can measure bone density at your hip and your spine. It is similar to having a regular X-ray. You may have this test to:  Diagnose a condition that causes weak or thin bones (osteoporosis).  Predict your risk of a broken bone (fracture).  Determine how well osteoporosis treatment is working. Tell a health care provider about:  Any allergies you have.  All medicines you are taking, including vitamins, herbs, eye drops, creams, and over-the-counter medicines.  Any problems you or family members have had with anesthetic medicines.  Any blood disorders you have.  Any surgeries you have had.  Any medical conditions you have.  Possibility of pregnancy.  Any other medical test you had within the previous 14 days that used contrast material. What are the risks? Generally, this is a safe procedure. However, problems can occur and may include the following:  This test exposes you to a very small amount of radiation.  The risks of radiation exposure may be greater to unborn children. What happens before the procedure?  Do not take any calcium supplements for 24 hours before having the test. You can otherwise eat and drink what you usually do.  Take off all metal jewelry, eyeglasses, dental appliances, and any other metal objects. What happens during the procedure?  You may lie on an exam table. There will  be an X-ray generator below you and an imaging device above you.  Other devices, such as boxes or braces, may be used to position your body properly for the scan.  You will need to lie still while the machine slowly scans your body.  The images will show up on a computer monitor. What happens after the procedure? You may need more testing at a later time. This information is not intended to replace advice given to you by your health care provider. Make sure you discuss any questions you have with your health care provider. Document Released: 06/17/2004 Document Revised: 11/01/2015 Document Reviewed: 11/03/2013  2017 [employer]   Fall Prevention in the Home Introduction Falls can cause injuries. They can happen to people of all ages. There are many things you can do to make your home safe and to help prevent falls. What can I do on the outside of my home?  Regularly fix the edges of walkways and driveways and fix any cracks.  Remove anything that might make you trip as you walk through a door, such as a raised step or threshold.  Trim any bushes or trees on the path to your home.  Use bright outdoor lighting.  Clear any walking paths of anything that might make someone trip, such as rocks or tools.  Regularly check to see if handrails are loose or broken. Make sure that both sides of any steps have handrails.  Any raised decks and porches should have guardrails on the edges.  Have any leaves, snow, or ice cleared regularly.  Use sand or salt on walking paths during winter.  Clean  up any spills in your garage right away. This includes oil or grease spills. What can I do in the bathroom?  Use night lights.  Install grab bars by the toilet and in the tub and shower. Do not use towel bars as grab bars.  Use non-skid mats or decals in the tub or shower.  If you need to sit down in the shower, use a plastic, non-slip stool.  Keep the floor dry. Clean up any water that spills  on the floor as soon as it happens.  Remove soap buildup in the tub or shower regularly.  Attach bath mats securely with double-sided non-slip rug tape.  Do not have throw rugs and other things on the floor that can make you trip. What can I do in the bedroom?  Use night lights.  Make sure that you have a light by your bed that is easy to reach.  Do not use any sheets or blankets that are too big for your bed. They should not hang down onto the floor.  Have a firm chair that has side arms. You can use this for support while you get dressed.  Do not have throw rugs and other things on the floor that can make you trip. What can I do in the kitchen?  Clean up any spills right away.  Avoid walking on wet floors.  Keep items that you use a lot in easy-to-reach places.  If you need to reach something above you, use a strong step stool that has a grab bar.  Keep electrical cords out of the way.  Do not use floor polish or wax that makes floors slippery. If you must use wax, use non-skid floor wax.  Do not have throw rugs and other things on the floor that can make you trip. What can I do with my stairs?  Do not leave any items on the stairs.  Make sure that there are handrails on both sides of the stairs and use them. Fix handrails that are broken or loose. Make sure that handrails are as long as the stairways.  Check any carpeting to make sure that it is firmly attached to the stairs. Fix any carpet that is loose or worn.  Avoid having throw rugs at the top or bottom of the stairs. If you do have throw rugs, attach them to the floor with carpet tape.  Make sure that you have a light switch at the top of the stairs and the bottom of the stairs. If you do not have them, ask someone to add them for you. What else can I do to help prevent falls?  Wear shoes that:  Do not have high heels.  Have rubber bottoms.  Are comfortable and fit you well.  Are closed at the toe. Do  not wear sandals.  If you use a stepladder:  Make sure that it is fully opened. Do not climb a closed stepladder.  Make sure that both sides of the stepladder are locked into place.  Ask someone to hold it for you, if possible.  Clearly mark and make sure that you can see:  Any grab bars or handrails.  First and last steps.  Where the edge of each step is.  Use tools that help you move around (mobility aids) if they are needed. These include:  Canes.  Walkers.  Scooters.  Crutches.  Turn on the lights when you go into a dark area. Replace any light bulbs as soon as  they burn out.  Set up your furniture so you have a clear path. Avoid moving your furniture around.  If any of your floors are uneven, fix them.  If there are any pets around you, be aware of where they are.  Review your medicines with your doctor. Some medicines can make you feel dizzy. This can increase your chance of falling. Ask your doctor what other things that you can do to help prevent falls. This information is not intended to replace advice given to you by your health care provider. Make sure you discuss any questions you have with your health care provider. Document Released: 03/22/2009 Document Revised: 11/01/2015 Document Reviewed: 06/30/2014  2017 [employer]  Health Maintenance, Female Introduction Adopting a healthy lifestyle and getting preventive care can go a long way to promote health and wellness. Talk with your health care provider about what schedule of regular examinations is right for you. This is a good chance for you to check in with your provider about disease prevention and staying healthy. In between checkups, there are plenty of things you can do on your own. Experts have done a lot of research about which lifestyle changes and preventive measures are most likely to keep you healthy. Ask your health care provider for more information. Weight and diet Eat a healthy diet  Be sure  to include plenty of vegetables, fruits, low-fat dairy products, and lean protein.  Do not eat a lot of foods high in solid fats, added sugars, or salt.  Get regular exercise. This is one of the most important things you can do for your health.  Most adults should exercise for at least 150 minutes each week. The exercise should increase your heart rate and make you sweat (moderate-intensity exercise).  Most adults should also do strengthening exercises at least twice a week. This is in addition to the moderate-intensity exercise. Maintain a healthy weight  Body mass index (BMI) is a measurement that can be used to identify possible weight problems. It estimates body fat based on height and weight. Your health care provider can help determine your BMI and help you achieve or maintain a healthy weight.  For females 74 years of age and older:  A BMI below 18.5 is considered underweight.  A BMI of 18.5 to 24.9 is normal.  A BMI of 25 to 29.9 is considered overweight.  A BMI of 30 and above is considered obese. Watch levels of cholesterol and blood lipids  You should start having your blood tested for lipids and cholesterol at 79 years of age, then have this test every 5 years.  You may need to have your cholesterol levels checked more often if:  Your lipid or cholesterol levels are high.  You are older than 79 years of age.  You are at high risk for heart disease. Cancer screening Lung Cancer  Lung cancer screening is recommended for adults 39-25 years old who are at high risk for lung cancer because of a history of smoking.  A yearly low-dose CT scan of the lungs is recommended for people who:  Currently smoke.  Have quit within the past 15 years.  Have at least a 30-pack-year history of smoking. A pack year is smoking an average of one pack of cigarettes a day for 1 year.  Yearly screening should continue until it has been 15 years since you quit.  Yearly screening  should stop if you develop a health problem that would prevent you from having lung  cancer treatment. Breast Cancer  Practice breast self-awareness. This means understanding how your breasts normally appear and feel.  It also means doing regular breast self-exams. Let your health care provider know about any changes, no matter how small.  If you are in your 20s or 30s, you should have a clinical breast exam (CBE) by a health care provider every 1-3 years as part of a regular health exam.  If you are 47 or older, have a CBE every year. Also consider having a breast X-ray (mammogram) every year.  If you have a family history of breast cancer, talk to your health care provider about genetic screening.  If you are at high risk for breast cancer, talk to your health care provider about having an MRI and a mammogram every year.  Breast cancer gene (BRCA) assessment is recommended for women who have family members with BRCA-related cancers. BRCA-related cancers include:  Breast.  Ovarian.  Tubal.  Peritoneal cancers.  Results of the assessment will determine the need for genetic counseling and BRCA1 and BRCA2 testing. Cervical Cancer  Your health care provider may recommend that you be screened regularly for cancer of the pelvic organs (ovaries, uterus, and vagina). This screening involves a pelvic examination, including checking for microscopic changes to the surface of your cervix (Pap test). You may be encouraged to have this screening done every 3 years, beginning at age 58.  For women ages 13-65, health care providers may recommend pelvic exams and Pap testing every 3 years, or they may recommend the Pap and pelvic exam, combined with testing for human papilloma virus (HPV), every 5 years. Some types of HPV increase your risk of cervical cancer. Testing for HPV may also be done on women of any age with unclear Pap test results.  Other health care providers may not recommend any screening  for nonpregnant women who are considered low risk for pelvic cancer and who do not have symptoms. Ask your health care provider if a screening pelvic exam is right for you.  If you have had past treatment for cervical cancer or a condition that could lead to cancer, you need Pap tests and screening for cancer for at least 20 years after your treatment. If Pap tests have been discontinued, your risk factors (such as having a new sexual partner) need to be reassessed to determine if screening should resume. Some women have medical problems that increase the chance of getting cervical cancer. In these cases, your health care provider may recommend more frequent screening and Pap tests. Colorectal Cancer  This type of cancer can be detected and often prevented.  Routine colorectal cancer screening usually begins at 79 years of age and continues through 79 years of age.  Your health care provider may recommend screening at an earlier age if you have risk factors for colon cancer.  Your health care provider may also recommend using home test kits to check for hidden blood in the stool.  A small camera at the end of a tube can be used to examine your colon directly (sigmoidoscopy or colonoscopy). This is done to check for the earliest forms of colorectal cancer.  Routine screening usually begins at age 16.  Direct examination of the colon should be repeated every 5-10 years through 79 years of age. However, you may need to be screened more often if early forms of precancerous polyps or small growths are found. Skin Cancer  Check your skin from head to toe regularly.  Tell your  health care provider about any new moles or changes in moles, especially if there is a change in a mole's shape or color.  Also tell your health care provider if you have a mole that is larger than the size of a pencil eraser.  Always use sunscreen. Apply sunscreen liberally and repeatedly throughout the day.  Protect  yourself by wearing long sleeves, pants, a wide-brimmed hat, and sunglasses whenever you are outside. Heart disease, diabetes, and high blood pressure  High blood pressure causes heart disease and increases the risk of stroke. High blood pressure is more likely to develop in:  People who have blood pressure in the high end of the normal range (130-139/85-89 mm Hg).  People who are overweight or obese.  People who are African American.  If you are 106-60 years of age, have your blood pressure checked every 3-5 years. If you are 75 years of age or older, have your blood pressure checked every year. You should have your blood pressure measured twice-once when you are at a hospital or clinic, and once when you are not at a hospital or clinic. Record the average of the two measurements. To check your blood pressure when you are not at a hospital or clinic, you can use:  An automated blood pressure machine at a pharmacy.  A home blood pressure monitor.  If you are between 59 years and 43 years old, ask your health care provider if you should take aspirin to prevent strokes.  Have regular diabetes screenings. This involves taking a blood sample to check your fasting blood sugar level.  If you are at a normal weight and have a low risk for diabetes, have this test once every three years after 79 years of age.  If you are overweight and have a high risk for diabetes, consider being tested at a younger age or more often. Preventing infection Hepatitis B  If you have a higher risk for hepatitis B, you should be screened for this virus. You are considered at high risk for hepatitis B if:  You were born in a country where hepatitis B is common. Ask your health care provider which countries are considered high risk.  Your parents were born in a high-risk country, and you have not been immunized against hepatitis B (hepatitis B vaccine).  You have HIV or AIDS.  You use needles to inject street  drugs.  You live with someone who has hepatitis B.  You have had sex with someone who has hepatitis B.  You get hemodialysis treatment.  You take certain medicines for conditions, including cancer, organ transplantation, and autoimmune conditions. Hepatitis C  Blood testing is recommended for:  Everyone born from [birth year] through 1963.  Anyone with known risk factors for hepatitis C. Sexually transmitted infections (STIs)  You should be screened for sexually transmitted infections (STIs) including gonorrhea and chlamydia if:  You are sexually active and are younger than 79 years of age.  You are older than 79 years of age and your health care provider tells you that you are at risk for this type of infection.  Your sexual activity has changed since you were last screened and you are at an increased risk for chlamydia or gonorrhea. Ask your health care provider if you are at risk.  If you do not have HIV, but are at risk, it may be recommended that you take a prescription medicine daily to prevent HIV infection. This is called pre-exposure prophylaxis (PrEP). You are  considered at risk if:  You are sexually active and do not regularly use condoms or know the HIV status of your partner(s).  You take drugs by injection.  You are sexually active with a partner who has HIV. Talk with your health care provider about whether you are at high risk of being infected with HIV. If you choose to begin PrEP, you should first be tested for HIV. You should then be tested every 3 months for as long as you are taking PrEP. Pregnancy  If you are premenopausal and you may become pregnant, ask your health care provider about preconception counseling.  If you may become pregnant, take 400 to 800 micrograms (mcg) of folic acid every day.  If you want to prevent pregnancy, talk to your health care provider about birth control (contraception). Osteoporosis and menopause  Osteoporosis is a disease in  which the bones lose minerals and strength with aging. This can result in serious bone fractures. Your risk for osteoporosis can be identified using a bone density scan.  If you are 57 years of age or older, or if you are at risk for osteoporosis and fractures, ask your health care provider if you should be screened.  Ask your health care provider whether you should take a calcium or vitamin D supplement to lower your risk for osteoporosis.  Menopause may have certain physical symptoms and risks.  Hormone replacement therapy may reduce some of these symptoms and risks. Talk to your health care provider about whether hormone replacement therapy is right for you. Follow these instructions at home:  Schedule regular health, dental, and eye exams.  Stay current with your immunizations.  Do not use any tobacco products including cigarettes, chewing tobacco, or electronic cigarettes.  If you are pregnant, do not drink alcohol.  If you are breastfeeding, limit how much and how often you drink alcohol.  Limit alcohol intake to no more than 1 drink per day for nonpregnant women. One drink equals 12 ounces of beer, 5 ounces of [place]a Prentiss, or 1 ounces of hard liquor.  Do not use street drugs.  Do not share needles.  Ask your health care provider for help if you need support or information about quitting drugs.  Tell your health care provider if you often feel depressed.  Tell your health care provider if you have ever been abused or do not feel safe at home. This information is not intended to replace advice given to you by your health care provider. Make sure you discuss any questions you have with your health care provider. Document Released: 12/09/2010 Document Revised: 11/01/2015 Document Reviewed: 02/27/2015  2017 Elsevier   Mammogram A mammogram is an X-ray of the breasts that is done to check for abnormal changes. This procedure can screen for and detect any changes that may suggest  breast cancer. A mammogram can also identify other changes and variations in the breast, such as:  Inflammation of the breast tissue (mastitis).  An infected area that contains a collection of pus (abscess).  A fluid-filled sac (cyst).  Fibrocystic changes. This is when breast tissue becomes denser, which can make the tissue feel rope-like or uneven under the skin.  Tumors that are not cancerous (benign). Tell a health care provider about:  Any allergies you have.  If you have breast implants.  If you have had previous breast disease, biopsy, or surgery.  If you are breastfeeding.  Any possibility that you could be pregnant, if this applies.  If you  are younger than age 15.  If you have a family history of breast cancer. What are the risks? Generally, this is a safe procedure. However, problems may occur, including:  Exposure to radiation. Radiation levels are very low with this test.  The results being misinterpreted.  The need for further tests.  The inability of the mammogram to detect certain cancers. What happens before the procedure?  Schedule your test about 1-2 weeks after your menstrual period. This is usually when your breasts are the least tender.  If you have had a mammogram done at a different facility in the past, get the mammogram X-rays or have them sent to your current exam facility in order to compare them.  Wash your breasts and under your arms the day of the test.  Do not wear deodorants, perfumes, lotions, or powders anywhere on your body on the day of the test.  Remove any jewelry from your neck.  Wear clothes that you can change into and out of easily. What happens during the procedure?  You will undress from the waist up and put on a gown.  You will stand in front of the X-ray machine.  Each breast will be placed between two plastic or glass plates. The plates will compress your breast for a few seconds. Try to stay as relaxed as possible  during the procedure. This does not cause any harm to your breasts and any discomfort you feel will be very brief.  X-rays will be taken from different angles of each breast. The procedure may vary among health care providers and hospitals. What happens after the procedure?  The mammogram will be examined by a specialist (radiologist).  You may need to repeat certain parts of the test, depending on the quality of the images. This is commonly done if the radiologist needs a better view of the breast tissue.  Ask when your test results will be ready. Make sure you get your test results.  You may resume your normal activities. This information is not intended to replace advice given to you by your health care provider. Make sure you discuss any questions you have with your health care provider. Document Released: 05/23/2000 Document Revised: 10/29/2015 Document Reviewed: 08/04/2014 Elsevier Interactive Patient Education  2017 [employer].

## 2016-07-13 NOTE — Progress Notes (Signed)
Medical screening examination/treatment/procedure(s) were performed by non-physician practitioner and as supervising provider I was immediately available for consultation/collaboration. I agree with treatment plan. Calone, Gregory, FNP   

## 2016-07-21 ENCOUNTER — Emergency Department (HOSPITAL_BASED_OUTPATIENT_CLINIC_OR_DEPARTMENT_OTHER): Payer: Medicare Other

## 2016-07-21 ENCOUNTER — Encounter (HOSPITAL_BASED_OUTPATIENT_CLINIC_OR_DEPARTMENT_OTHER): Payer: Self-pay | Admitting: *Deleted

## 2016-07-21 ENCOUNTER — Emergency Department (HOSPITAL_COMMUNITY): Payer: Medicare Other

## 2016-07-21 ENCOUNTER — Emergency Department (HOSPITAL_BASED_OUTPATIENT_CLINIC_OR_DEPARTMENT_OTHER)
Admission: EM | Admit: 2016-07-21 | Discharge: 2016-07-21 | Disposition: A | Discharge status: Home / Self Care | Payer: Medicare Other | Attending: Emergency Medicine | Admitting: Emergency Medicine

## 2016-07-21 ENCOUNTER — Telehealth: Payer: Self-pay | Admitting: Family

## 2016-07-21 DIAGNOSIS — M79602 Pain in left arm: Secondary | ICD-10-CM | POA: Diagnosis not present

## 2016-07-21 DIAGNOSIS — Z79899 Other long term (current) drug therapy: Secondary | ICD-10-CM | POA: Diagnosis not present

## 2016-07-21 DIAGNOSIS — R209 Unspecified disturbances of skin sensation: Secondary | ICD-10-CM | POA: Diagnosis not present

## 2016-07-21 DIAGNOSIS — R200 Anesthesia of skin: Secondary | ICD-10-CM | POA: Diagnosis not present

## 2016-07-21 DIAGNOSIS — R202 Paresthesia of skin: Secondary | ICD-10-CM | POA: Diagnosis not present

## 2016-07-21 DIAGNOSIS — R208 Other disturbances of skin sensation: Secondary | ICD-10-CM

## 2016-07-21 LAB — TROPONIN I: Troponin I: 0.04 ng/mL (ref <0.03)

## 2016-07-21 LAB — PROTIME-INR
INR: 1.04
Prothrombin Time: 13.6 seconds (ref 11.4–15.2)

## 2016-07-21 LAB — COMPREHENSIVE METABOLIC PANEL
ALK PHOS: 65 U/L (ref 38–126)
ALT: 19 U/L (ref 14–54)
AST: 26 U/L (ref 15–41)
Albumin: 4.4 g/dL (ref 3.5–5.0)
Anion gap: 8 (ref 5–15)
BUN: 11 mg/dL (ref 6–20)
CALCIUM: 9.2 mg/dL (ref 8.9–10.3)
CHLORIDE: 100 mmol/L — AB (ref 101–111)
CO2: 31 mmol/L (ref 22–32)
CREATININE: 0.47 mg/dL (ref 0.44–1.00)
Glucose, Bld: 101 mg/dL — ABNORMAL HIGH (ref 65–99)
Potassium: 3.9 mmol/L (ref 3.5–5.1)
Sodium: 139 mmol/L (ref 135–145)
TOTAL PROTEIN: 7.2 g/dL (ref 6.5–8.1)
Total Bilirubin: 1 mg/dL (ref 0.3–1.2)

## 2016-07-21 LAB — CBC
HEMATOCRIT: 37.3 % (ref 36.0–46.0)
Hemoglobin: 12.4 g/dL (ref 12.0–15.0)
MCH: 31.6 pg (ref 26.0–34.0)
MCHC: 33.2 g/dL (ref 30.0–36.0)
MCV: 94.9 fL (ref 78.0–100.0)
Platelets: 255 10*3/uL (ref 150–400)
RBC: 3.93 MIL/uL (ref 3.87–5.11)
RDW: 12.1 % (ref 11.5–15.5)
WBC: 6.8 10*3/uL (ref 4.0–10.5)

## 2016-07-21 LAB — DIFFERENTIAL
Basophils Absolute: 0 10*3/uL (ref 0.0–0.1)
Basophils Relative: 0 %
Eosinophils Absolute: 0.1 10*3/uL (ref 0.0–0.7)
Eosinophils Relative: 1 %
Lymphocytes Relative: 18 %
Lymphs Abs: 1.2 10*3/uL (ref 0.7–4.0)
MONO ABS: 0.6 10*3/uL (ref 0.1–1.0)
MONOS PCT: 9 %
NEUTROS ABS: 4.9 10*3/uL (ref 1.7–7.7)
Neutrophils Relative %: 72 %

## 2016-07-21 LAB — RAPID URINE DRUG SCREEN, HOSP PERFORMED
Amphetamines: NOT DETECTED
Barbiturates: NOT DETECTED
Benzodiazepines: NOT DETECTED
Cocaine: NOT DETECTED
OPIATES: NOT DETECTED
Tetrahydrocannabinol: NOT DETECTED

## 2016-07-21 LAB — URINALYSIS, ROUTINE W REFLEX MICROSCOPIC
Bilirubin Urine: NEGATIVE
GLUCOSE, UA: NEGATIVE mg/dL
Hgb urine dipstick: NEGATIVE
Ketones, ur: NEGATIVE mg/dL
LEUKOCYTES UA: NEGATIVE
Nitrite: NEGATIVE
PH: 5.5 (ref 5.0–8.0)
Protein, ur: NEGATIVE mg/dL
SPECIFIC GRAVITY, URINE: 1.006 (ref 1.005–1.030)

## 2016-07-21 LAB — APTT: APTT: 32 s (ref 24–36)

## 2016-07-21 LAB — ETHANOL

## 2016-07-21 MED ORDER — PREDNISONE 20 MG PO TABS: 40.0000 mg | ORAL_TABLET | Freq: Every day | ORAL | 0 refills | Status: DC

## 2016-07-21 NOTE — Telephone Encounter (Signed)
Seen in the ED.

## 2016-07-21 NOTE — ED Provider Notes (Addendum)
[facility] DEPT [facility] Provider Note   CSN: [id number] Arrival date & time: 07/21/16  1049     History   Chief Complaint Chief Complaint  Patient presents with  . Arm Pain    HPI Julia Lucero is a 81 y.o. female.  [place] with a chief complaint of left arm pain. This been going on for years per the husband. She said this is been going on since she got her pneumonia vaccine in September. She feels like it got worse when she got a recent booster about a week ago. Now having tingling to her fourth and fifth digit of her left hand. Feels tingling to the left side of her face as well as some sensitivity to her left upper teeth. Denies any difficulty with speech headaches neck pain. Denies injury.   The history is provided by the patient.  Arm Pain  This is a chronic problem. The current episode started more than 1 week ago. The problem occurs constantly. The problem has not changed since onset.Pertinent negatives include no chest pain, no headaches and no shortness of breath. Nothing aggravates the symptoms. Nothing relieves the symptoms.    Past Medical History:  Diagnosis Date  . Chronic fatigue   . Disorder of bone and cartilage, unspecified   . Oxygen dependent   . Pulmonary hypertension   . Restrictive lung disease   . Rickettsia infection     Patient Active Problem List   Diagnosis Date Noted  . Need for prophylactic vaccination against Streptococcus pneumoniae (pneumococcus) 03/14/2016  . Pectus excavatum 09/10/2015  . Seasonal and perennial allergic rhinitis 07/24/2015  . Urticaria 03/16/2015  . Angioedema 03/13/2015  . Other chest pain 01/18/2015  . Bibasilar crackles 09/08/2014  . Dyspnea and respiratory abnormality 09/08/2014  . Cor pulmonale, chronic ([place]) 09/08/2014  . Hx of adenomatous colonic polyps 07/29/2012  . History of rickets 07/27/2012  . KYPHOSIS ASSOCIATED WITH OTHER CONDITION 06/28/2010  . SHORTNESS OF BREATH (SOB) 09/11/2009  . Pulmonary  hypertension ([place]) 09/10/2009  . OSTEOPENIA 09/10/2009    Past Surgical History:  Procedure Laterality Date  . OOPHORECTOMY      OB History    No data available       Home Medications    Prior to Admission medications   Medication Sig Start Date End Date Taking? Authorizing Provider  amLODipine (NORVASC) 5 MG tablet TAKE 1 TABLET(5 MG) BY MOUTH DAILY 06/10/16   Lorretta Harp, MD  furosemide (LASIX) 40 MG tablet Take 0.5 tablets (20 mg total) by mouth daily. 06/10/16   Lorretta Harp, MD  Multiple Vitamin (MULTIVITAMIN) capsule Take 1 capsule by mouth daily.      Historical Provider, MD  OXYGEN-HELIUM IN Inhale 2 L into the lungs continuous.    Historical Provider, MD    Family History Family History  Problem Relation Age of Onset  . Diabetes Father   . Heart disease Father   . Hyperlipidemia Brother   . Arthritis Sister   . Colon cancer Mother   . Heart disease Mother   . Hypertension Mother   . Heart disease Maternal Grandfather   . Heart disease Paternal Grandmother   . Heart disease Paternal Grandfather   . Hyperlipidemia Brother   . Hypertension Brother     Social History Social History  Substance Use Topics  . Smoking status: Never Smoker  . Smokeless tobacco: Never Used  . Alcohol use 3.6 oz/week    6 Standard drinks or equivalent  per week     Comment: Rarely     Allergies   Methocarbamol; Penicillins; Sulfonamide derivatives; and Amoxicillin   Review of Systems Review of Systems  Constitutional: Negative for chills and fever.  HENT: Negative for congestion and rhinorrhea.   Eyes: Negative for redness and visual disturbance.  Respiratory: Negative for shortness of breath and wheezing.   Cardiovascular: Negative for chest pain and palpitations.  Gastrointestinal: Negative for nausea and vomiting.  Genitourinary: Negative for dysuria and urgency.  Musculoskeletal: Positive for arthralgias and myalgias.  Skin: Negative for pallor and wound.    Neurological: Negative for dizziness and headaches.       Tingling to 4-5th digit of left hand, left face, perioral with increased sensitivity to left upper incisors.     Physical Exam Updated Vital Signs BP 119/60   Pulse 69   Temp 97.9 F (36.6 C)   Resp 16   Ht 4\' 11"  (1.499 m)   Wt 134 lb (60.8 kg)   SpO2 100%   BMI 27.06 kg/m   Physical Exam  Constitutional: She is oriented to person, place, and time. She appears well-developed and well-nourished. No distress.  HENT:  Head: Normocephalic and atraumatic.  Eyes: EOM are normal. Pupils are equal, round, and reactive to light.  Neck: Normal range of motion. Neck supple.  Cardiovascular: Normal rate and regular rhythm.  Exam reveals no gallop and no friction rub.   No murmur heard. Pulmonary/Chest: Effort normal. She has no wheezes. She has no rales.  Abdominal: Soft. She exhibits no distension and no mass. There is no tenderness. There is no guarding.  Musculoskeletal: She exhibits no edema or tenderness.  Clawing to bilateral hands  Neurological: She is alert and oriented to person, place, and time. She has normal strength. No cranial nerve deficit or sensory deficit. She displays a negative Romberg sign. Coordination and gait normal. GCS eye subscore is 4. GCS verbal subscore is 5. GCS motor subscore is 6.  Subjective decreased sensation to the left face.   PMS intact to bilateral upper extremities.  Skin: Skin is warm and dry. She is not diaphoretic.  Psychiatric: She has a normal mood and affect. Her behavior is normal.  Nursing note and vitals reviewed.    ED Treatments / Results  Labs (all labs ordered are listed, but only abnormal results are displayed) Labs Reviewed  COMPREHENSIVE METABOLIC PANEL - Abnormal; Notable for the following:       Result Value   Chloride 100 (*)    Glucose, Bld 101 (*)    All other components within normal limits  TROPONIN I - Abnormal; Notable for the following:    Troponin I  0.04 (*)    All other components within normal limits  ETHANOL  PROTIME-INR  APTT  CBC  DIFFERENTIAL  RAPID URINE DRUG SCREEN, HOSP PERFORMED  URINALYSIS, ROUTINE W REFLEX MICROSCOPIC    EKG  EKG Interpretation  Date/Time:  Monday July 21 2016 12:10:11 EST Ventricular Rate:  70 PR Interval:    QRS Duration: 80 QT Interval:  393 QTC Calculation: 424 R Axis:   57 Text Interpretation:  Sinus arrhythmia Probable left atrial enlargement No old tracing to compare Confirmed by Kiowa Peifer MD, DANIEL [phone]) on 07/21/2016 12:56:06 PM       Radiology Ct Head Wo Contrast  Result Date: 07/21/2016 CLINICAL DATA:  Left arm pain, numbness in finger tips an in d face for 3 days. EXAM: CT HEAD WITHOUT CONTRAST TECHNIQUE: Contiguous axial images  were obtained from the base of the skull through the vertex without intravenous contrast. COMPARISON:  None. FINDINGS: Brain: No evidence of an acute infarct, acute hemorrhage, mass lesion, mass effect or hydrocephalus. Atrophy. Periventricular low attenuation. Vascular: No hyperdense vessel or unexpected calcification. Skull: Normal. Negative for fracture or focal lesion. Sinuses/Orbits: No acute finding. Other: None. IMPRESSION: 1. No acute intracranial abnormality. 2. Atrophy and chronic microvascular white matter ischemic changes. Electronically Signed   By: Lorin Picket M.D.   On: 07/21/2016 12:10    Procedures Procedures (including critical care time)  Medications Ordered in ED Medications - No data to display   Initial Impression / Assessment and Plan / ED Course  I have reviewed the triage vital signs and the nursing notes.  Pertinent labs & imaging results that were available during my care of the patient were reviewed by me and considered in my medical decision making (see chart for details).     81 yo F With a chief complaint of numbness to her left fourth and fifth digit as well as the left side of her face. She is having difficulty  describing this to me. Initially was described as tingling and described the tingling was a good sign that he'll be less likely she had a stroke with positive symptoms she said that maybe it was more numb.  I discussed the case with Dr. Lawana Pai, recommends MRI to rule out stroke, if negative, d/c home.  [facility].   Patient had the ED stroke panel ordered. Her troponin was just measurably positive at 0.04. She is not having chest pain shortness of breath. Her symptoms are nonexertional. I'm not sure what to make of this positive troponin. Could be elevated if she is having a stroke. I think this likely needs to be repeated to see if there is any change.  CRITICAL CARE Performed by: Cecilio Asper   Total critical care time: 35 minutes  Critical care time was exclusive of separately billable procedures and treating other patients.  Critical care was necessary to treat or prevent imminent or life-threatening deterioration.  Critical care was time spent personally by me on the following activities: development of treatment plan with patient and/or surrogate as well as nursing, discussions with consultants, evaluation of patient's response to treatment, examination of patient, obtaining history from patient or surrogate, ordering and performing treatments and interventions, ordering and review of laboratory studies, ordering and review of radiographic studies, pulse oximetry and re-evaluation of patient's condition.   The patients results and plan were reviewed and discussed.   Any x-rays performed were independently reviewed by myself.   Differential diagnosis were considered with the presenting HPI.  Medications - No data to display  Vitals:   07/21/16 1102 07/21/16 1104 07/21/16 1221 07/21/16 1230  BP:  151/77  119/60  Pulse:  73  69  Resp:  20  16  Temp:  97.9 F (36.6 C) 97.9 F (36.6 C)   TempSrc:  Oral    SpO2:  100%  100%  Weight: 134 lb (60.8 kg)       Height: 4\' 11"  (1.499 m)       Final diagnoses:  Decreased sensation      Final Clinical Impressions(s) / ED Diagnoses   Final diagnoses:  Decreased sensation    New Prescriptions New Prescriptions   No medications on file     Deno Etienne, DO 07/21/16 [place], DO 07/21/16 1324  Deno Etienne, DO 07/21/16 1324

## 2016-07-21 NOTE — ED Notes (Signed)
Patient Alert and oriented X4. Stable and ambulatory. Patient verbalized understanding of the discharge instructions.  Patient belongings were taken by the patient.  

## 2016-07-21 NOTE — ED Provider Notes (Signed)
I welcomed the patient after her arrival to [facility].  MRI ordered. She states that her numbness has improved.  8:42 PM MRI discussed with the patient and her husband. No new complaints. We discussed the possibility of radiculopathy versus other possibilities, and I encouraged her to follow-up with primary care.    Carmin Muskrat, MD 07/21/16 2042

## 2016-07-21 NOTE — Discharge Instructions (Signed)
As discussed, your evaluation today has been largely reassuring.  But, it is important that you monitor your condition carefully, and do not hesitate to return to the ED if you develop new, or concerning changes in your condition. ? ?Otherwise, please follow-up with your physician for appropriate ongoing care. ? ?

## 2016-07-21 NOTE — Telephone Encounter (Signed)
PLEASE NOTE: All timestamps contained within this report are represented as [place] Standard Time. CONFIDENTIALTY NOTICE: This fax transmission is intended only for the addressee. It contains information that is legally privileged, confidential or otherwise protected from use or disclosure. If you are not the intended recipient, you are strictly prohibited from reviewing, disclosing, copying using or disseminating any of this information or taking any action in reliance on or regarding this information. If you have received this fax in error, please notify us immediately by telephone so that we can arrange for its return to [place]. Phone: [phone], Toll-Free: [phone], Fax: [phone] Page: 1 of 1 Call Id: [id number] [place] Patient Name: Julia Lucero DOB: January 14, 1936 Initial Comment Caller says that his wife got a pneumonia shot a few weeks ago and her left arm has been hurting ever since she got it its been in hurting. And sometimes she feels numbness. She also has numbness in her neck and face, Teeth feel tender. Nurse Assessment Nurse: Andria Frames, RN, [place] Date/Time (Eastern Time): 07/21/2016 10:11:54 AM Confirm and document reason for call. If symptomatic, describe symptoms. ---Caller says that his wife got a pneumonia shot a few weeks ago and her left arm has been hurting ever since she got it its been in hurting. And sometimes she has been having tingling in the little finger and the one next to it. She has had some numbness in the left side of the face on the cheek it has been all the way across. She has had pain since the shot. Does the patient have any new or worsening symptoms? ---Yes Will a triage be completed? ---Yes Related visit to physician within the last 2 weeks? ---No Does the PT have any chronic conditions? (i.e. diabetes, asthma, etc.) ---Yes List chronic conditions.  ---pulmonary hypertension, Is this a behavioral health or substance abuse call? ---No Guidelines Guideline Title Affirmed Question Affirmed Notes Neurologic Deficit Difficult to awaken or acting confused (e.g., disoriented, slurred speech) Final Disposition User Call EMS 911 Now Hensel, RN, [facility] - ED Disagree/Comply: Disagree Disagree/Comply Reason: Disagree with instructions

## 2016-07-21 NOTE — ED Notes (Signed)
Patient transported to MRI 

## 2016-07-21 NOTE — ED Triage Notes (Signed)
She has had pain in her her left arm since having a pneumonia the end of last year. She states she has had pain down her arm since that time. Numbness in her finger tips and face x 3 days. She is on home oxygen.

## 2016-09-17 ENCOUNTER — Other Ambulatory Visit: Payer: Self-pay | Admitting: Cardiovascular Disease

## 2016-09-17 ENCOUNTER — Telehealth: Payer: Self-pay | Admitting: Internal Medicine

## 2016-09-17 NOTE — Telephone Encounter (Signed)
Spoke with pt, she is concerned about an pneumonia injection that she got in Oct from another office. States she has now having pain in that arm. Informed the pt that this was not done at our office and that she would have to reach out to her PCP. Pt refused and stated she will never go to their office again and if requesting Dr. Chase Caller to advise her of what to do.  MR-please let us know how would you like to proceed

## 2016-09-19 ENCOUNTER — Other Ambulatory Visit: Payer: Self-pay | Admitting: Cardiovascular Disease

## 2016-09-19 MED ORDER — AMLODIPINE BESYLATE 5 MG PO TABS: ORAL_TABLET | ORAL | 0 refills | Status: DC

## 2016-09-19 MED ORDER — FUROSEMIDE 40 MG PO TABS: 20.0000 mg | ORAL_TABLET | Freq: Every day | ORAL | 0 refills | Status: DC

## 2016-09-19 NOTE — Telephone Encounter (Signed)
Rx(s) sent to pharmacy electronically.  

## 2016-09-19 NOTE — Telephone Encounter (Signed)
Does she have a rheumatologis or orthopod?  Can she move her left arm?  Does she have sensation?  Let me know   Dr. Brand Males, M.D., [facility].C.P Pulmonary and Critical Care Medicine Staff Physician [place] [facility] Pager: [phone], If no answer or between  15:00h - 7:00h: call [phone]  09/19/2016 12:22 AM

## 2016-09-19 NOTE — Telephone Encounter (Signed)
New message     *STAT* If patient is at the pharmacy, call can be transferred to refill team.   1. Which medications need to be refilled? (please list name of each medication and dose if known) amlodipine 5 mg, furosemide 20 mg  2. Which pharmacy/location (including street and city if local pharmacy) is medication to be sent to? [facility] [phone]   3. Do they need a 30 day or 90 day supply? 90 day

## 2016-09-19 NOTE — Telephone Encounter (Signed)
MR  Please Advise-  Pt's response to your questions were:  Does she have a rheumatologis or orthopod? NO to both  Can she move her left arm? YES  Does she have sensation? YES

## 2016-09-22 NOTE — Telephone Encounter (Signed)
Pt aware of recommendations   Nothing further needed.  

## 2016-09-22 NOTE — Telephone Encounter (Signed)
I do not know how to assess if pnuemonia shot in oct is related to ongoing pain now in left arm esp if she can move and hve sensation. She should try PCP Mauricio Po, FNP but if not, then an urgent care or change PCP  Dr. Brand Males, M.D., [facility].C.P Pulmonary and Critical Care Medicine Staff Physician [place] [facility] Pager: [phone], If no answer or between  15:00h - 7:00h: call [phone]  09/22/2016 4:47 PM

## 2016-09-30 DIAGNOSIS — H35033 Hypertensive retinopathy, bilateral: Secondary | ICD-10-CM | POA: Diagnosis not present

## 2016-09-30 DIAGNOSIS — H43813 Vitreous degeneration, bilateral: Secondary | ICD-10-CM | POA: Diagnosis not present

## 2016-09-30 DIAGNOSIS — H2513 Age-related nuclear cataract, bilateral: Secondary | ICD-10-CM | POA: Diagnosis not present

## 2016-09-30 DIAGNOSIS — H401132 Primary open-angle glaucoma, bilateral, moderate stage: Secondary | ICD-10-CM | POA: Diagnosis not present

## 2016-10-07 ENCOUNTER — Encounter: Payer: Self-pay | Admitting: Cardiovascular Disease

## 2016-10-07 ENCOUNTER — Ambulatory Visit (INDEPENDENT_AMBULATORY_CARE_PROVIDER_SITE_OTHER): Payer: Medicare Other | Admitting: Cardiovascular Disease

## 2016-10-07 DIAGNOSIS — I272 Pulmonary hypertension, unspecified: Secondary | ICD-10-CM | POA: Diagnosis not present

## 2016-10-07 NOTE — Progress Notes (Signed)
10/07/2016 Julia Lucero   January 17, 1936  [id number]  Primary Physician Mauricio Po, [place] Primary Cardiologist: Lorretta Harp MD [name]  HPI:   The patient is a 81 year old, mildly overweight, married Caucasian female, mother of 2 who I last saw 1 in the office 06/22/14. I initially saw her April 2011 with new onset congestive heart failure with CT scan that showed cardiomegaly, interstitial edema and pleural effusions. This was preceded by an upper airway infection followed by a transcontinental flight. A 2D echo performed August 25, 2009, showed normal LV systolic function with a flattened septum consistent with RV pressure overload and mildly reduced systolic function with severe pulmonary hypertension. Her Myoview was negative. She was placed on low dose amlodipine and furosemide and her symptoms resolved. Dr. Chase Caller thought that her symptoms were related to restrictive lung disease secondary to her remote rickets. She is on home O2 and has otherwise been stable since I last saw her. Followup echo performed May 11, 2012, was entirely normal without evidence of pulmonary hypertension. Since I saw her one year ago she's been relatively asymptomatic on home O2. She has been following with Dr. Chase Caller as well as obtaining a second opinion at [facility] thought that she did not have primary hypertension but rather restrictive lung disease secondary to her rickets. Since I saw 2 years ago she's remained stable on chronic home O2.   Current Outpatient Prescriptions  Medication Sig Dispense Refill  . amLODipine (NORVASC) 5 MG tablet TAKE 1 TABLET(5 MG) BY MOUTH DAILY 90 tablet 0  . furosemide (LASIX) 40 MG tablet Take 0.5 tablets (20 mg total) by mouth daily. 45 tablet 0  . Multiple Vitamin (MULTIVITAMIN) capsule Take 1 capsule by mouth daily.      . OXYGEN-HELIUM IN Inhale 2 L into the lungs continuous.     No current facility-administered  medications for this visit.     Allergies  Allergen Reactions  . Methocarbamol     Altered thought process  . Penicillins   . Sulfonamide Derivatives   . Amoxicillin Rash    Social History   Social History  . Marital status: Married    Spouse name: N/A  . Number of children: 2  . Years of education: 55   Occupational History  . Not on file.   Social History Main Topics  . Smoking status: Never Smoker  . Smokeless tobacco: Never Used  . Alcohol use 3.6 oz/week    6 Standard drinks or equivalent per week     Comment: Rarely  . Drug use: No  . Sexual activity: No   Other Topics Concern  . Not on file   Social History Narrative   Fun: Read, exercise, keep up with family   Denies abuse and feels safe at home.      Review of Systems: General: negative for chills, fever, night sweats or weight changes.  Cardiovascular: negative for chest pain, dyspnea on exertion, edema, orthopnea, palpitations, paroxysmal nocturnal dyspnea or shortness of breath Dermatological: negative for rash Respiratory: negative for cough or wheezing Urologic: negative for hematuria Abdominal: negative for nausea, vomiting, diarrhea, bright red blood per rectum, melena, or hematemesis Neurologic: negative for visual changes, syncope, or dizziness All other systems reviewed and are otherwise negative except as noted above.    Blood pressure (!) 142/64, pulse 84, height 4\' 11"  (1.499 m), weight 135 lb (61.2 kg).  General appearance: alert and no distress Neck: no adenopathy, no  carotid bruit, no JVD, supple, symmetrical, trachea midline and thyroid not enlarged, symmetric, no tenderness/mass/nodules Lungs: clear to auscultation bilaterally Heart: regular rate and rhythm, S1, S2 normal, no murmur, click, rub or gallop Extremities: extremities normal, atraumatic, no cyanosis or edema  EKG not performed today  ASSESSMENT AND PLAN:   Pulmonary hypertension ([place]) Ms. Kahler was initially seen  for pulmonary hypertension. This was thought to be related to restrictive heart disease from her gets. Follow-up echo performed 05/11/12 revealed normal LV size and function normal pulmonary pressures. She is on chronic home O2 otherwise is asymptomatic.      Lorretta Harp MD FACP,FACC,FAHA, [facility] 10/07/2016 2:26 PM

## 2016-10-07 NOTE — Patient Instructions (Signed)
Your physician recommends that you schedule a follow-up appointment with Dr. Gwenlyn Found as needed.

## 2016-10-07 NOTE — Assessment & Plan Note (Signed)
Julia Lucero was initially seen for pulmonary hypertension. This was thought to be related to restrictive heart disease from her gets. Follow-up echo performed 05/11/12 revealed normal LV size and function normal pulmonary pressures. She is on chronic home O2 otherwise is asymptomatic.

## 2016-10-14 ENCOUNTER — Encounter: Payer: Self-pay | Admitting: Internal Medicine

## 2016-10-14 ENCOUNTER — Ambulatory Visit (INDEPENDENT_AMBULATORY_CARE_PROVIDER_SITE_OTHER): Payer: Medicare Other | Admitting: Internal Medicine

## 2016-10-14 VITALS — BP 148/64 | HR 74 | Ht 59.0 in | Wt 134.0 lb

## 2016-10-14 DIAGNOSIS — R0600 Dyspnea, unspecified: Secondary | ICD-10-CM | POA: Diagnosis not present

## 2016-10-14 DIAGNOSIS — R0689 Other abnormalities of breathing: Secondary | ICD-10-CM

## 2016-10-14 DIAGNOSIS — I272 Pulmonary hypertension, unspecified: Secondary | ICD-10-CM

## 2016-10-14 NOTE — Patient Instructions (Signed)
ICD-9-CM ICD-10-CM   1. Dyspnea and respiratory abnormality 786.09 R06.00     R06.89   2. Pulmonary hypertension (HCC) 416.8 I27.20     Stable disease  Plan - walk test on room air in office - use oxygen as before  Followup 1 year or sooner if needed

## 2016-10-14 NOTE — Progress Notes (Signed)
Subjective:     Patient ID: Julia Lucero, female   DOB: 14-Nov-1935, 81 y.o.   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 Decompensated Cor Pulmonale and discovery of pulmonary hypertension on ECHO (no right heart cath) following trip to [facility] in March 2011 due to pneumonia (CT chest  march 2011 at [place] consolidation and bialteral effusion). Negative Autoimmune Profile May 2011 except for mild raised CRP and borderline positive rheumatoid factor   March 04, 2010: feels well. Doing rehab. Not desaturating there. Due to fly to [facility] 10/20/2011on [employer]. Lot of questions on  flight o2. WE walked her today 185 feet x 3 laps and lowest o2 was only 92%. This is a huge improvement because in MAy 2011 she did desaturate with exertion. No new complaints. She continues on lasix and amlodipine started by Dr. Gwenlyn Found  in Apri 2011. Compliant with medicines. No interim problems. Denies edema, cough, orthopnea, paroxysmal nocturnal dyspnea, wheeze. She has seen Dr. Gwenlyn Found in interim and per his records he has resasured her and is following expectantly. REC: EXPECTANT FOLLOWUP  OV 11/01/2010: OVerall well. Has completed rehab. Underwent PT for muscle strengthening but now doing it on own. Unclear if has increasing dyspnea or not. AT times she denies it but at times she states she is marginally worse. Certainly seems to get tired by end of the day which might be baseline. Uses o2 wwiht exertion.  Continues lasix and amlodipine. Not seen Dr. Gwenlyn Found in interim either. No worsening edema. No wheeze. No chest pain. No syncope. No weight loss. No pnd. No orthopnea. Had 3 flight trips in interim and did well. Has repeat question on mechanism of restrictive chest disease and pulmonary hypertension. Walking desaturatin test today - and dropped to 88% - 87% after 2 laps  REC  Please continue daily exercise - make  sure heart rate is < 120-125 - you can take your heart rate that high but not higher  Make sure pulse ox is always >88% with or without oxygen  Use oxygen with exercise and walking and moving around  We will set up overnight oxygen study on room air to determine your oxygen need at night  Please see Dr. Gwenlyn Found as well  REturn to see me in 6 months  Come sooner if there are problems  OV 08/04/2011  OVerall well. Doing exercises 5-6 times per week in 2 sessions daily. Really helping. Overall stable. Class 2 dyspnea with exertion that is stable and relieved by rest. Uses o2 wwiht exertion.  Continues lasix and amlodipine. Not seen Dr. Gwenlyn Found in interim either. No worsening edema. No wheeze. No chest pain. No syncope. No weight loss. No pnd. No orthopnea. Had several flight trips in interim to [place] and did well; does DVT precaution exercises. Walking desaturatin test today - and dropped to 88% - 87% after 1 laps x 185 feet. The exertional desaturation is marginally worse than last OV. We will keep an eye on this because she is otherwise well  Past, Family, Social reviewed: no change since last visit   Please continue daily exercise - make sure heart rate is < 120-125 - you can take your heart rate that high but not higher  Make sure pulse ox is always >88% with or without oxygen  Use oxygen with exercise and walking and moving around  REturn to see me in 8 Months. Wil monitor her exertional desaturations  Come  sooner if there are problems   OV 03/30/2012 Resp wise stable. Says she feels hollow inside. Unable to explain. Spiritually weary. No other iesues. Walking desaturation test on 03/30/2012 185 feet x 1 laps:  did desaturate. Rest pulse ox was 94%, final pulse ox was 88%. HR response 93/min at rest to 114/min at peak exertion at 1 laps. The distance to desaturation is similar to Feb 2013 but worse since May 2012  [facility] Ensure heart ok with Dr Gwenlyn Found   10/20/2012 Office Visit  followup   related restrictive cage defect related to rickets   - Last seen in December 2013. After that for my recommendation she did visit cardiology Dr. Gwenlyn Found. She and husband recollect having an echocardiogram that is reported as normal per her history. Overall she's stable although she feels that over the span of 2 years she's is a little bit more fatigued than baseline especially early in the morning. She's not sure if she's had her vitamin D and thyroid function tests checked. Also of note, she went to [place] by car and return a few weeks ago. She did take several breaks every few hours and stretch the legs. Upon return she had mild pedal edema that lasted 2 days and then resolved spontaneously. There was no warmth of the leg or hemoptysis or chest pain or undue dyspnea. It is noted that she does have bilateral varicose veins. No other health issues  - Walk test today 10/20/2012: 185 feet x3 laps on room air: Pulse oximetry 86% at the third lap with a heart rate of 107   Past, Family, Social reviewed: no change since last visit    #Respiratory  - clinically you are stable with good oxygen levels for 550 feet of exertion  - suspect some deconditioning over winter; so will re-refer to pulmonary rehabilitation - continue current care  #Fatigue - talk to ARONSON,RICHARD A, MD about checking vitamin D and thyroid levels  #Followup - 9 months or sooner if needed  OV 09/29/2013  Chief Complaint  Patient presents with  . Follow-up    pt c/o SOB with exertion.     followup  related restrictive cage defect related to rickets with associated pulmonary hypertension  - Last seen in May 2014. Since then overall stable. Last visit she only desaturated on room air after walking 185 feet x3 laps. However, she continues to use 2 L nasal cannula are 24 hours. She's feels that she needs this. But otherwise he does be that she's not any worse or any better. She does say that her travel plans are unchanged but  as she ages she finds that it's more complicated to organize do to dependency her oxygen. Is no worsening edema or chest pain or cough or sputum production.  - Walk test on room air her and made her feet x3 laps: At rest her heart rate was 76 per minute. Pulse ox is 96 a minute. After exertion 185 feet x1 lap she desaturated down to 87% and she stayed down at 87%. Peak heart rate was 101. Therefore exercise was terminated. This is similar to ? Worse than a year ago  - She is somewhat anxious about her long term future. We discussed [facility] referral to pulm htn clinic and she is open to doing that  Social history: Husband suffered is herniation recently and is on conservative treatment     OV 09/08/2014  Chief Complaint  Patient presents with  . Follow-up    Pt stated  she was feeling well until catching cold. Pt c/o sinus congestion and cough. Pt currently taking levaquin. Pt c/o dry cough. Pt denies increase in SOB and CP/tightness.     Annual follow-up childhood rickets associated with thoracic cage defect associated with clinical pulmonary hypertension /cor pulmolane based on echo. Clinically she suffers from exertional hypoxemia.   I have been following Mrs. Finchum for many years. The last 1 year she reports has been stable. In the interim in summer 2015 she did go to [employer] pulmonary hypertension program. According to her history she was reassured that problems are related to thoracic cage defect and she was discharged from follow-up She does not recollect having had a right heart catheterization though chart review at [facility] suggest that she had it but I'm unable to access these results. She did have a VQ scan which was determined is a low probability for pulmonary embolism. She did have a chest x-ray that showed enlarged cardiac silhouette along with pectus excavatum. And also thoracic scoliosis. Currently overall she is doing well but a week ago she did pick up a cold and she is on a  ten-day Levaquin. Today Walking desaturation test 185 feet 3 laps on room air: She walked all 3 laps and at the very end dropped to 88%. This is probably better than a year ago   of note, exam shows bibasal crackles which I believe might be new for her    OV 09/10/2015  Chief Complaint  Patient presents with  . Follow-up    Pt here for 1 year f/u. Pt states her breathing is doing well and she states she feels stronger. Pt denies cough, wheezing, CP/tightness, and swelling.     Annual follow-up childhood rickets associated with thoracic cage defect associated with clinical pulmonary hypertension /cor pulmolane based on echo. Clinically she suffers from exertional hypoxemia.    This is an annual follow-up. Last seen April 2016. In the interim she developed tongue swelling or lip swelling with hot sauce. She saw Dr. Annamaria Boots for this. She's been reassured. Overall she's feeling well. She has been to [place] bunch of times to see the grandchildren. Most recent trip was a few weeks ago. She feels stronger. She has less exertional dyspnea than a year ago. She does aerobic work associated with light weight training of her upper extremities. There are no new issues. Walking desaturation test 185 feet 3 laps on room air: 1 lap and desat to84% (baseine 2 years ago is 87%)  So stable  I did do high resolution CT chest April 2016 for crackles on her chest exam but was clear without any interstitial lung disease.    OV 10/14/2016  Chief Complaint  Patient presents with  . Follow-up    Pt here for 12 month f/u. Pt states her SOB is doing well. Pt denies cough, CP/tightness, and f/c/s.     Annual follow-up in childhood rickets associated with thoracic cage defect associated with clinical pulmonary hypertension/cor pulmonale based on echo. Clinically she suffers from exertional hypoxemia  This is a one year follow-up. This year she did see her cardiologist Dr. Alvester Chou and his been reassured. She  continues to travel to [place]. She has no problems. She uses oxygen only with exertion and at daytime at rest. She does not use nocturnal oxygen. She says that as she ages she is noticing thoracic restriction on her breathing but otherwise feels good. She continues with aerobic exercises and walking desaturation test 185 feet 3  laps on room air: 94% at rest, ended at 91% after 3rd lap. Improved    has a past medical history of Chronic fatigue; Disorder of bone and cartilage, unspecified; Oxygen dependent; Pulmonary hypertension ([place]); Restrictive lung disease; and Rickettsia infection.   reports that she has never smoked. She has never used smokeless tobacco.  Past Surgical History:  Procedure Laterality Date  . OOPHORECTOMY      Allergies  Allergen Reactions  . Methocarbamol     Altered thought process  . Penicillins   . Sulfonamide Derivatives   . Amoxicillin Rash    Immunization History  Administered Date(s) Administered  . Influenza Split 03/12/2011, 03/30/2012  . Influenza Whole 03/04/2010  . Influenza, High Dose Seasonal PF 04/17/2016  . Influenza, Seasonal, Injecte, Preservative Fre 04/26/2014  . Influenza,inj,Quad PF,36+ Mos 03/09/2013, 05/28/2015  . Pneumococcal Conjugate-13 03/14/2016  . Pneumococcal Polysaccharide-23 06/10/2007, 03/09/2012    Family History  Problem Relation Age of Onset  . Diabetes Father   . Heart disease Father   . Hyperlipidemia Brother   . Arthritis Sister   . Colon cancer Mother   . Heart disease Mother   . Hypertension Mother   . Heart disease Maternal Grandfather   . Heart disease Paternal Grandmother   . Heart disease Paternal Grandfather   . Hyperlipidemia Brother   . Hypertension Brother      Current Outpatient Prescriptions:  .  amLODipine (NORVASC) 5 MG tablet, TAKE 1 TABLET(5 MG) BY MOUTH DAILY, Disp: 90 tablet, Rfl: 0 .  furosemide (LASIX) 40 MG tablet, Take 0.5 tablets (20 mg total) by mouth daily., Disp: 45 tablet,  Rfl: 0 .  Multiple Vitamin (MULTIVITAMIN) capsule, Take 1 capsule by mouth daily.  , Disp: , Rfl:  .  OXYGEN-HELIUM IN, Inhale 2 L into the lungs continuous., Disp: , Rfl:     Review of Systems     Objective:   Physical Exam Vitals:   10/14/16 1107  BP: (!) 148/64  Pulse: 74  SpO2: 94%  Weight: 134 lb (60.8 kg)  Height: 4\' 11"  (1.499 m)    Estimated body mass index is 27.06 kg/m as calculated from the following:   Height as of this encounter: 4\' 11"  (1.499 m).   Weight as of this encounter: 134 lb (60.8 kg).  Brief physical exam shows sharp stated woman with Rickettsia chest gait normal heart sounds and lung sounds alert and oriented 3. Some osteoarthritis in the fingers. No cyanosis no clubbing no edema. No loud second heart sound.    Assessment:       ICD-9-CM ICD-10-CM   1. Dyspnea and respiratory abnormality 786.09 R06.00     R06.89   2. Pulmonary hypertension (HCC) 416.8 I27.20        Plan:          Stable disease  Plan - walk test on room air in office - use oxygen as before  Followup 1 year or sooner if needed   Dr. Brand Males, M.D., [facility].C.P Pulmonary and Critical Care Medicine Staff Physician [place] [facility] Pager: [phone], If no answer or between  15:00h - 7:00h: call [phone]  10/14/2016 11:27 AM

## 2016-10-20 ENCOUNTER — Telehealth: Payer: Self-pay | Admitting: Family

## 2016-10-20 NOTE — Telephone Encounter (Signed)
Pt wanting to see Dr. Lorelei Pont as she is closer in proximity to [facility] office. Please advise.

## 2016-10-20 NOTE — Telephone Encounter (Signed)
Ok with me 

## 2016-10-20 NOTE — Telephone Encounter (Signed)
ok 

## 2016-10-21 NOTE — Telephone Encounter (Signed)
Scheduled pt

## 2016-10-22 ENCOUNTER — Encounter: Payer: Self-pay | Admitting: Family Medicine

## 2016-10-22 ENCOUNTER — Ambulatory Visit (INDEPENDENT_AMBULATORY_CARE_PROVIDER_SITE_OTHER): Payer: Medicare Other | Admitting: Family Medicine

## 2016-10-22 VITALS — BP 142/58 | HR 79 | Temp 97.7°F | Ht 59.0 in | Wt 137.2 lb

## 2016-10-22 DIAGNOSIS — B001 Herpesviral vesicular dermatitis: Secondary | ICD-10-CM | POA: Diagnosis not present

## 2016-10-22 DIAGNOSIS — Z9981 Dependence on supplemental oxygen: Secondary | ICD-10-CM

## 2016-10-22 DIAGNOSIS — I2781 Cor pulmonale (chronic): Secondary | ICD-10-CM

## 2016-10-22 MED ORDER — VALACYCLOVIR HCL 1 G PO TABS: ORAL_TABLET | ORAL | 0 refills | Status: DC

## 2016-10-22 NOTE — Patient Instructions (Signed)
It was nice to see you today- take care and use the valtrex as needed for cold sores.    Please come and see me in 3-4 months for a physical

## 2016-10-22 NOTE — Progress Notes (Signed)
[place] at [facility] [address] [place], [place] [address] [phone]  Date:  10/22/2016   Name:  Julia Lucero   DOB:  1936-03-25   MRN:  [id number]  PCP:  [place], FNP    Chief Complaint: Transfer of Care [facility] Dr. Elna Breslow)   History of Present Illness:  Julia Lucero is a 81 y.o. very pleasant female patient who presents with the following:  Here today to establish care- former pt of Dr. Elna Breslow She sees Dr. Gwenlyn Found for cardiology and [facility] for pulmonology Reviewed their recent notes in Epic  She has a history of pulmonary hypertension.  She first got sick with cor pulmonale in 2011 after a long plane flight.  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   Moved to this area in 1970, but grew up in [place]. Met her husband while in school in [place] She and her husband have 2 daughters, one in [place] and one in [place].  Her daughter who lives in [place] has 4 sons.   She has noted some vision change over the years. She has her eyes examined on a regular basis.   She thinks that she had a bone density in the last couple of years   She will occasionally have a cold sore and would like any medication that might be helpful for this.  In fact she feels like she has one coming on now  Patient Active Problem List   Diagnosis Date Noted  . Need for prophylactic vaccination against Streptococcus pneumoniae (pneumococcus) 03/14/2016  . Pectus excavatum 09/10/2015  . Seasonal and perennial allergic rhinitis 07/24/2015  . Urticaria 03/16/2015  . Angioedema 03/13/2015  . Other chest pain  01/18/2015  . Bibasilar crackles 09/08/2014  . Dyspnea and respiratory abnormality 09/08/2014  . Cor pulmonale, chronic ([place]) 09/08/2014  . Hx of adenomatous colonic polyps 07/29/2012  . History of rickets 07/27/2012  . KYPHOSIS ASSOCIATED WITH OTHER CONDITION 06/28/2010  . SHORTNESS OF BREATH (SOB) 09/11/2009  . Pulmonary hypertension ([place]) 09/10/2009  . OSTEOPENIA 09/10/2009    Past Medical History:  Diagnosis Date  . Allergy   . Chronic fatigue   . COPD (chronic obstructive pulmonary disease) ([place])   . Disorder of bone and cartilage, unspecified   . Hypertension   . Oxygen deficiency   . Oxygen dependent   . Pulmonary hypertension ([place])   . Restrictive lung disease   . Rickettsia infection     Past Surgical History:  Procedure Laterality Date  . OOPHORECTOMY      Social History  Substance Use Topics  . Smoking status: Never Smoker  . Smokeless tobacco: Never Used  . Alcohol use 3.6 oz/week    6 Standard drinks or equivalent per week     Comment: Rarely    Family History  Problem Relation Age of Onset  . Diabetes Father   . Heart disease Father   . Hyperlipidemia Brother   . Arthritis Sister   . Colon cancer Mother   . Heart  disease Mother   . Hypertension Mother   . Heart disease Maternal Grandfather   . Heart disease Paternal Grandmother   . Heart disease Paternal Grandfather   . Hyperlipidemia Brother   . Hypertension Brother     Allergies  Allergen Reactions  . Methocarbamol     Altered thought process  . Penicillins   . Sulfonamide Derivatives   . Amoxicillin Rash    Medication list has been reviewed and updated.  Current Outpatient Prescriptions on File Prior to Visit  Medication Sig Dispense Refill  . amLODipine (NORVASC) 5 MG tablet TAKE 1 TABLET(5 MG) BY MOUTH DAILY 90 tablet 0  . furosemide (LASIX) 40 MG tablet Take 0.5 tablets (20 mg total) by mouth daily. 45 tablet 0  . Multiple Vitamin (MULTIVITAMIN) capsule Take 1 capsule by mouth  daily.      . OXYGEN-HELIUM IN Inhale 2 L into the lungs continuous.     No current facility-administered medications on file prior to visit.     Review of Systems:  As per HPI- otherwise negative. No fever, chills, CP, unusual SOB, nausea, vomiting,diarrhea or rash    Physical Examination: Vitals:   10/22/16 1537  BP: (!) 142/58  Pulse: 79  Temp: 97.7 F (36.5 C)   Vitals:   10/22/16 1537  Weight: 137 lb 3.2 oz (62.2 kg)  Height: _0  (1.499 m)   Body mass index is 27.71 kg/m. Ideal Body Weight: Weight in (lb) to have BMI = 25: 123.5  GEN: WDWN, NAD, Non-toxic, A & O x 3, petite stature, uses oxygen HEENT: Atraumatic, Normocephalic. Neck supple. No masses, No LAD.  Bilateral TM wnl, oropharynx normal.  PEERL,EOMI.  I cannot see any sign of cold sore but pt has noted the prodrome  Ears and Nose: No external deformity. CV: RRR, No M/G/R. No JVD. No thrill. No extra heart sounds. PULM: CTA B, no wheezes, crackles, rhonchi. No retractions. No resp. distress. No accessory muscle use. EXTR: No c/c/e NEURO Normal gait.  PSYCH: Normally interactive. Conversant. Not depressed or anxious appearing.  Calm demeanor.    Assessment and Plan: Herpes labialis - Plan: valACYclovir (VALTREX) 1000 MG tablet  Chronic cor pulmonale (HCC)  Oxygen dependent  Valtrex to use as needed for cold sores- explained use Oxygen as usual She will see me for a CPE in the fall   Signed Lamar Blinks, MD

## 2016-10-30 ENCOUNTER — Ambulatory Visit (INDEPENDENT_AMBULATORY_CARE_PROVIDER_SITE_OTHER): Payer: Medicare Other | Admitting: Family Medicine

## 2016-10-30 ENCOUNTER — Encounter: Payer: Self-pay | Admitting: Neurology

## 2016-10-30 VITALS — BP 138/78 | HR 79 | Temp 98.0°F | Ht 59.0 in | Wt 135.0 lb

## 2016-10-30 DIAGNOSIS — R519 Headache, unspecified: Secondary | ICD-10-CM

## 2016-10-30 DIAGNOSIS — R51 Headache: Secondary | ICD-10-CM

## 2016-10-30 NOTE — Patient Instructions (Signed)
It was very nice to see you today!  I am going to refer you to a neurologist to help [place] evaluate the discomfort in your face.  You may have a condition called trigeminal neuralgia- I gave you a hand- out to look over and find out more about this condition.  If you have nay change or worsening in your symptoms please let me know.  Otherwise we will contact you with your neurology appointment info

## 2016-10-30 NOTE — Progress Notes (Signed)
[facility] at [facility] [address], [place], [place] [address] [phone]  Date:  10/30/2016   Name:  Julia Lucero   DOB:  [birth year]   MRN:  [id number]  PCP:  Darreld Mclean, MD    Chief Complaint: Arm Pain (c/o arm pain and tenderness around mouth and cheek. )   History of Present Illness:  Julia Lucero is a 81 y.o. very pleasant female patient who presents with the following:  Last seen by myself earlier this month for her first visit- see following HPI:  Here today to establish care- former pt of Dr. Elna Breslow She sees Dr. Gwenlyn Found for cardiology and [facility] for pulmonology Reviewed their recent notes in Epic  She has a history of pulmonary hypertension.  She first got sick with cor pulmonale in 2011 after a long plane flight.  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   Moved to this area in 1970, but grew up in [place]. Met her husband while in school in [place] She and her husband have 2 daughters, one in [place] and one in [place].  Her daughter who lives in [place] has 4 sons.   She has noted some vision change over the years. She has her eyes examined on a regular basis.   She thinks that she had a bone density in the last couple of years   She will occasionally have a cold sore and would like any medication that might be helpful for this.  In fact she feels like she has one coming on now  Here today with concern of pain in her left side face for several months She had a prevnar 13 last October.  Her left arm felt sore after the shot but did not swell or turn red.  Eventually the  pain seemed to travel up her neck and then settle into her left face and teeth; this has been there since October- about a week after her shot.  She also feels like her face is a bit numb as well It is not that painful now, but at one point over the winter she went to the ER with pain and they did an MRI of her head- normal  She did see her DDS and they checked her out, no sign of a cavity, negative dental films She feels like her upper lip is fat but it does not appear swollen on exam No slurred speech or facial drooping Normal swallowing  She is now all UTD on her pneumonia vaccines and will not need to have another one   Never had any rash associated with this issue, she has not noted a fever  Never had anything like this in the past.   Over the last few months it seems like her sx are consistent and present daily.  Seems worse early in the day, but it eases off as the day goes on.   She did try taking some aspirin- this did seem to help some  Her pain is not severe and  she does not necessarily want any medication for this She does have occasional cold sores which we treat with valtrex- these are long standing   Patient Active Problem List   Diagnosis Date Noted  . Need for prophylactic vaccination against Streptococcus pneumoniae (pneumococcus) 03/14/2016  . Pectus excavatum 09/10/2015  . Seasonal and perennial allergic rhinitis 07/24/2015  . Urticaria 03/16/2015  . Angioedema 03/13/2015  . Other chest pain 01/18/2015  . Bibasilar crackles 09/08/2014  . Dyspnea and respiratory abnormality 09/08/2014  . Cor pulmonale, chronic ([place]) 09/08/2014  . Hx of adenomatous colonic polyps 07/29/2012  . History of rickets 07/27/2012  . KYPHOSIS ASSOCIATED WITH OTHER CONDITION 06/28/2010  . SHORTNESS OF BREATH (SOB) 09/11/2009  . Pulmonary hypertension ([place]) 09/10/2009  . OSTEOPENIA 09/10/2009    Past Medical History:  Diagnosis Date  . Allergy   . Chronic fatigue   . COPD (chronic  obstructive pulmonary disease) ([place])   . Disorder of bone and cartilage, unspecified   . Hypertension   . Oxygen deficiency   . Oxygen dependent   . Pulmonary hypertension ([place])   . Restrictive lung disease   . Rickettsia infection     Past Surgical History:  Procedure Laterality Date  . OOPHORECTOMY      Social History  Substance Use Topics  . Smoking status: Never Smoker  . Smokeless tobacco: Never Used  . Alcohol use 3.6 oz/week    6 Standard drinks or equivalent per week     Comment: Rarely    Family History  Problem Relation Age of Onset  . Diabetes Father   . Heart disease Father   . Hyperlipidemia Brother   . Arthritis Sister   . Colon cancer Mother   . Heart disease Mother   . Hypertension Mother   . Heart disease Maternal Grandfather   . Heart disease Paternal Grandmother   . Heart disease Paternal Grandfather   . Hyperlipidemia Brother   . Hypertension Brother     Allergies  Allergen Reactions  . Methocarbamol     Altered thought process  . Penicillins   . Sulfonamide Derivatives   . Amoxicillin Rash    Medication list has been reviewed and updated.  Current Outpatient Prescriptions on File Prior to Visit  Medication Sig Dispense Refill  . amLODipine (NORVASC) 5 MG tablet TAKE 1 TABLET(5 MG) BY MOUTH DAILY 90 tablet 0  . furosemide (LASIX) 40 MG tablet Take 0.5 tablets (20 mg total) by mouth daily. 45 tablet 0  . Multiple Vitamin (MULTIVITAMIN) capsule Take 1 capsule by mouth daily.      . OXYGEN-HELIUM IN Inhale 2 L into the lungs continuous.    . valACYclovir (VALTREX) 1000 MG tablet Take 2 pills (2000 mg) at the first sign of cold sore, repeat 2 pills 12 hours later 20 tablet 0   No current facility-administered medications on file prior to visit.     Review of Systems:  As per HPI- otherwise negative.  No CP, she does use oxygen No rash, no redness or inflammation of her gums   Physical Examination: Vitals:   10/30/16 1343  BP:  138/78  Pulse: 79  Temp: 98 F (36.7 C)   Vitals:   10/30/16 1343  Weight: 135 lb (61.2 kg)  Height: _0  (1.499 m)   Body mass index is 27.27 kg/m. Ideal Body Weight: Weight in (lb) to have BMI = 25: 123.5  GEN: WDWN, NAD, Non-toxic, A & O x 3, overweight, here with her husband  today Wearing her oxygen with [place] as per her normal  HEENT: Atraumatic, Normocephalic. Neck supple. No masses, No LAD.  Bilateral TM wnl, oropharynx normal.  PEERL,EOMI.   Her teeth appear in good repair- no redness or inflammation of the gums no sign of decay Her sx are centered on numbers 9 and 10 Normal facial motion and sensation No redness or reproducible tenderness of her face  Ears and Nose: No external deformity. CV: RRR, No M/G/R. No JVD. No thrill. No extra heart sounds. PULM: CTA B, no wheezes, crackles, rhonchi. No retractions. No resp. distress. No accessory muscle use. EXTR: No c/c/e NEURO Normal gait.   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 Conversant. Not depressed or anxious appearing.  Calm demeanor.    Assessment and Plan: Left-sided face pain - Plan: Ambulatory referral to Neurology  Suspect trigeminal neuralgia Referral to neurology for evaluation She does not wish to try any medication at this time as her pain is not bad She will let me know if any change or worsening in her sx in the meantime   Signed Lamar Blinks, MD

## 2016-11-22 NOTE — Progress Notes (Signed)
[facility] at [facility] [address] [place], [place] [address] [phone]  Date:  11/24/2016   Name:  Julia Lucero   DOB:  18-Dec-1935   MRN:  [id number]  PCP:  Darreld Mclean, MD    Chief Complaint: Ear Pain (Left)   History of Present Illness:  Julia Lucero is a 81 y.o. very pleasant female patient who presents with the following:  Here today for a follow-up visit- I last saw here last month with likely trigeminal neuralgia. At that time she had noted several months of left sided facial pain which seemed to start with left shoulder pain (which she relates started after a prevnar shot last fall) She was seen in the ER in February for her left arm pain.  She had a reassuring work-up including an MRI of her head.    I referred her to neurology after last visit but she does not have an appt for another 10 days  She does not feel like her sx have gotten any worse or any better. They really have not changed at all.  She came in as she was hoping she would be better by now She continues to have pain in her left cheek and face.  The pain is present all the time.   Her teeth are also sensitive.  She has already seen her DDS and had x-rays, etc; no sign of a dental etiolgoy of her pain No change if she is chewing   No fever She has pulmonary hypertension and does use oxygen 24/7 However she does not seem to have CHF as her most recent echo was fairly benign No change in her pulmonary sx  Last time we discussed working dx of trigeminal neuralgia.  She had not wanted to start any medication at that time but would now like to give it a try.  Tegretol is the most commonly used first line drug for this condition and she does not have any contraindication   Patient Active Problem List   Diagnosis Date Noted  . Need for prophylactic vaccination against Streptococcus pneumoniae (pneumococcus) 03/14/2016  . Pectus excavatum 09/10/2015  .  Seasonal and perennial allergic rhinitis 07/24/2015  . Urticaria 03/16/2015  . Angioedema 03/13/2015  . Other chest pain 01/18/2015  . Bibasilar crackles 09/08/2014  . Dyspnea and respiratory abnormality 09/08/2014  . Cor pulmonale, chronic ([place]) 09/08/2014  . Hx of adenomatous colonic polyps 07/29/2012  . History of rickets 07/27/2012  . KYPHOSIS ASSOCIATED WITH OTHER CONDITION 06/28/2010  . SHORTNESS OF BREATH (SOB) 09/11/2009  . Pulmonary hypertension ([place]) 09/10/2009  . OSTEOPENIA 09/10/2009    Past Medical History:  Diagnosis Date  . Allergy   . Chronic fatigue   . COPD (chronic obstructive pulmonary disease) ([place])   . Disorder of bone and cartilage, unspecified   . Hypertension   . Oxygen deficiency   . Oxygen dependent   . Pulmonary hypertension ([place])   . Restrictive lung disease   . Rickettsia infection     Past Surgical History:  Procedure Laterality Date  . OOPHORECTOMY      Social History  Substance Use Topics  . Smoking status: Never Smoker  . Smokeless tobacco: Never Used  . Alcohol use 3.6 oz/week    6 Standard drinks or equivalent per week     Comment: Rarely    Family History  Problem Relation Age of Onset  . Diabetes Father   . Heart  disease Father   . Hyperlipidemia Brother   . Arthritis Sister   . Colon cancer Mother   . Heart disease Mother   . Hypertension Mother   . Heart disease Maternal Grandfather   . Heart disease Paternal Grandmother   . Heart disease Paternal Grandfather   . Hyperlipidemia Brother   . Hypertension Brother     Allergies  Allergen Reactions  . Methocarbamol     Altered thought process  . Penicillins   . Sulfonamide Derivatives   . Amoxicillin Rash    Medication list has been reviewed and updated.  Current Outpatient Prescriptions on File Prior to Visit  Medication Sig Dispense Refill  . amLODipine (NORVASC) 5 MG tablet TAKE 1 TABLET(5 MG) BY MOUTH DAILY 90 tablet 0  . furosemide (LASIX) 40 MG tablet  Take 0.5 tablets (20 mg total) by mouth daily. 45 tablet 0  . Multiple Vitamin (MULTIVITAMIN) capsule Take 1 capsule by mouth daily.      . OXYGEN-HELIUM IN Inhale 2 L into the lungs continuous.    . valACYclovir (VALTREX) 1000 MG tablet Take 2 pills (2000 mg) at the first sign of cold sore, repeat 2 pills 12 hours later 20 tablet 0   No current facility-administered medications on file prior to visit.     Review of Systems:  As per HPI- otherwise negative.   Physical Examination: Vitals:   11/24/16 1351  BP: 135/63  Pulse: 85  Temp: 97.4 F (36.3 C)   Vitals:   11/24/16 1351  Weight: 137 lb 3.2 oz (62.2 kg)  Height: 4' 11"  (1.499 m)   Body mass index is 27.71 kg/m. Ideal Body Weight: Weight in (lb) to have BMI = 25: 123.5  GEN: WDWN, NAD, Non-toxic, A & O x 3, looks well, petite build, wearing her oxygen and [place] as per her usual HEENT: Atraumatic, Normocephalic. Neck supple. No masses, No LAD.  Bilateral TM wnl, oropharynx normal.  PEERL,EOMI.   Cannot reproduce her pain by pressing on her face or jaw.  No LAD noted  Ears and Nose: No external deformity. CV: RRR, No M/G/R. No JVD. No thrill. No extra heart sounds. PULM: CTA B, no wheezes, crackles, rhonchi. No retractions. No resp. distress. No accessory muscle use. EXTR: No c/c/e NEURO Normal gait.  PSYCH: Normally interactive. Conversant. Not depressed or anxious appearing.  Calm demeanor.    Assessment and Plan: Trigeminal neuralgia - Plan: carbamazepine (CARBATROL) 100 MG 12 hr capsule  Here today with likely trigeminal neuralgia.  She would like to try medication pending her neurology appt in about 10 days which is fine.  Will have her start on 100 mg BID Pt is not of asian descent so we are not concerned about HLA B 1502 allele in her She will let me know if any change or concern pending her neurology appt  Signed Lamar Blinks, MD

## 2016-11-24 ENCOUNTER — Encounter: Payer: Self-pay | Admitting: Family Medicine

## 2016-11-24 ENCOUNTER — Other Ambulatory Visit: Payer: Self-pay | Admitting: Family Medicine

## 2016-11-24 ENCOUNTER — Ambulatory Visit (INDEPENDENT_AMBULATORY_CARE_PROVIDER_SITE_OTHER): Payer: Medicare Other | Admitting: Family Medicine

## 2016-11-24 VITALS — BP 135/63 | HR 85 | Temp 97.4°F | Ht 59.0 in | Wt 137.2 lb

## 2016-11-24 DIAGNOSIS — G500 Trigeminal neuralgia: Secondary | ICD-10-CM | POA: Diagnosis not present

## 2016-11-24 MED ORDER — CARBAMAZEPINE ER 100 MG PO CP12: 100.0000 mg | ORAL_CAPSULE | Freq: Two times a day (BID) | ORAL | 1 refills | Status: DC

## 2016-11-24 NOTE — Patient Instructions (Signed)
I think that you have trigeminal neuralgia (a nerve disorder that can cause persistent facial pain).  We are going to try a low dose of carbamazepine for your symptoms- 100 mg twice a day  Please start on this medication - this will give it a chance to hopefully start working prior to your upcoming neurology appt Carbamazepaine can make you feel sleepy- however you are on such a low dose that I hope this will not be a significant issue for you

## 2016-11-25 ENCOUNTER — Other Ambulatory Visit: Payer: Self-pay | Admitting: *Deleted

## 2016-11-25 MED ORDER — AMLODIPINE BESYLATE 5 MG PO TABS: ORAL_TABLET | ORAL | 3 refills | Status: DC

## 2016-12-05 ENCOUNTER — Ambulatory Visit (INDEPENDENT_AMBULATORY_CARE_PROVIDER_SITE_OTHER): Payer: Medicare Other | Admitting: Neurology

## 2016-12-05 ENCOUNTER — Encounter: Payer: Self-pay | Admitting: Neurology

## 2016-12-05 VITALS — BP 106/60 | HR 80 | Ht 59.0 in | Wt 136.0 lb

## 2016-12-05 DIAGNOSIS — G501 Atypical facial pain: Secondary | ICD-10-CM

## 2016-12-05 DIAGNOSIS — M79602 Pain in left arm: Secondary | ICD-10-CM

## 2016-12-05 MED ORDER — GABAPENTIN 100 MG PO CAPS: ORAL_CAPSULE | ORAL | 0 refills | Status: DC

## 2016-12-05 NOTE — Progress Notes (Signed)
NEUROLOGY CONSULTATION NOTE  Julia Lucero MRN: [id number] DOB: 1935-11-14  Referring provider: Dr. Lorelei Pont Primary care provider: Dr. Lorelei Pont  Reason for consult:  Trigeminal neuralgia  HISTORY OF PRESENT ILLNESS: Julia Lucero is a 81 year old female with COPD, hypertension and history of Rickets who presents for trigeminal neuralgia.  She is accompanied by her husband who supplements history.  The patient has difficulty explaining.  It seems that in September, she received the pneumonia shot in her left upper arm.  Afterwards, she developed a pain and numbness that radiated down the the hand (not the fingers) and up to the scapula and posterior neck, as well as in the left lower face (radiating across her mouth).  Initially, she had some tingling in the left lower face but now only endorses dull pressure-like discomfort involving the left side of her upper teeth and maxilla.  She denies paroxysmal shooting pain or pain aggravated by chewing, brushing teeth or talking.  She was prescribed carbamazepine but never started it.  She saw the dentist and underwent a negative workup.  She still has a dull aching in her left arm and possibly some tingling.  She denies neck pain, radicular pain down the arm or weakness.  CT of head and MRI of brain without contrast from 07/21/16 were personally reviewed and were unremarkable.  07/21/16 CMP:  Na 139, K 3.9, Cl 100, CO2 31, glucose 101, BUN 11, Cr 0.47, total bili 1.0, ALP 65, AST 26 and ALT 19.  PAST MEDICAL HISTORY: Past Medical History:  Diagnosis Date  . Allergy   . Chronic fatigue   . COPD (chronic obstructive pulmonary disease) ([place])   . Disorder of bone and cartilage, unspecified   . Hypertension   . Oxygen deficiency   . Oxygen dependent   . Pulmonary hypertension ([place])   . Restrictive lung disease   . Rickettsia infection     PAST SURGICAL HISTORY: Past Surgical History:  Procedure Laterality Date  . OOPHORECTOMY       MEDICATIONS: Current Outpatient Prescriptions on File Prior to Visit  Medication Sig Dispense Refill  . amLODipine (NORVASC) 5 MG tablet TAKE 1 TABLET(5 MG) BY MOUTH DAILY 90 tablet 3  . carbamazepine (CARBATROL) 100 MG 12 hr capsule Take 1 capsule (100 mg total) by mouth 2 (two) times daily. 60 capsule 1  . furosemide (LASIX) 40 MG tablet Take 0.5 tablets (20 mg total) by mouth daily. 45 tablet 0  . Multiple Vitamin (MULTIVITAMIN) capsule Take 1 capsule by mouth daily.      . OXYGEN-HELIUM IN Inhale 2 L into the lungs continuous.    . valACYclovir (VALTREX) 1000 MG tablet Take 2 pills (2000 mg) at the first sign of cold sore, repeat 2 pills 12 hours later 20 tablet 0   No current facility-administered medications on file prior to visit.     ALLERGIES: Allergies  Allergen Reactions  . Methocarbamol     Altered thought process  . Penicillins   . Sulfonamide Derivatives   . Amoxicillin Rash    FAMILY HISTORY: Family History  Problem Relation Age of Onset  . Diabetes Father   . Heart disease Father   . Hyperlipidemia Brother   . Arthritis Sister   . Colon cancer Mother   . Heart disease Mother   . Hypertension Mother   . Heart disease Maternal Grandfather   . Heart disease Paternal Grandmother   . Heart disease Paternal Grandfather   . Hyperlipidemia Brother   .  Hypertension Brother     SOCIAL HISTORY: Social History   Social History  . Marital status: Married    Spouse name: N/A  . Number of children: 2  . Years of education: 43   Occupational History  . Not on file.   Social History Main Topics  . Smoking status: Never Smoker  . Smokeless tobacco: Never Used  . Alcohol use 3.6 oz/week    6 Standard drinks or equivalent per week     Comment: Rarely  . Drug use: No  . Sexual activity: No   Other Topics Concern  . Not on file   Social History Narrative   Fun: Read, exercise, keep up with family   Denies abuse and feels safe at home.     REVIEW OF  SYSTEMS: Constitutional: No fevers, chills, or sweats, no generalized fatigue, change in appetite Eyes: No visual changes, double vision, eye pain Ear, nose and throat: No hearing loss, ear pain, nasal congestion, sore throat Cardiovascular: No chest pain, palpitations Respiratory:  No shortness of breath at rest or with exertion, wheezes GastrointestinaI: No nausea, vomiting, diarrhea, abdominal pain, fecal incontinence Genitourinary:  No dysuria, urinary retention or frequency Musculoskeletal:  No neck pain, back pain Integumentary: No rash, pruritus, skin lesions Neurological: as above Psychiatric: No depression, insomnia, anxiety Endocrine: No palpitations, fatigue, diaphoresis, mood swings, change in appetite, change in weight, increased thirst Hematologic/Lymphatic:  No purpura, petechiae. Allergic/Immunologic: no itchy/runny eyes, nasal congestion, recent allergic reactions, rashes  PHYSICAL EXAM: Vitals:   12/05/16 1304  BP: 106/60  Pulse: 80   General: No acute distress.  Patient appears well-groomed.   Head:  Normocephalic/atraumatic Eyes:  fundi examined but not visualized Neck: supple, no paraspinal tenderness, full range of motion Back: No paraspinal tenderness Heart: regular rate and rhythm Lungs: Clear to auscultation bilaterally. Vascular: No carotid bruits. Neurological Exam: Mental status: alert and oriented to person, place, and time, recent and remote memory intact, fund of knowledge intact, attention and concentration intact, speech fluent and not dysarthric, language intact. Cranial nerves: CN I: not tested CN II: pupils equal, round and reactive to light, visual fields intact CN III, IV, VI:  full range of motion, no nystagmus, no ptosis CN V: facial sensation intact CN VII: upper and lower face symmetric CN VIII: hearing intact CN IX, X: gag intact, uvula midline CN XI: sternocleidomastoid and trapezius muscles intact CN XII: tongue midline Bulk &  Tone: normal, no fasciculations. Motor:  5/5 throughout  Sensation: temperature and vibration sensation intact. Deep Tendon Reflexes:  2+ throughout, toes downgoing.  Finger to nose testing:  Without dysmetria.  Heel to shin:  Without dysmetria.  Gait:  Normal station and stride.  Able to turn. Romberg negative.  IMPRESSION: 1.  Atypical left sided facial pain, involving the teeth and the maxilla.  Presentation is not consistent with classic trigeminal neuralgia.  It does not cause any significant pain, rather a mild discomfort 2.  Left arm pain.  Again, unusual presentation.  It is not classic symptoms of a radiculopathy or mononeuropathy.  PLAN: 1.  We will start gabapentin 100mg  at bedtime and titrate to three times daily.  We can continue to titrate dose as needed/tolerated. 2.  Follow up in 3 months  Thank you for allowing me to take part in the care of this patient.  Metta Clines, DO  CC:  Lamar Blinks, MD

## 2016-12-05 NOTE — Patient Instructions (Signed)
1.  To treat pain, we will try gabapentin 100mg  capsules.  Take 1 capsule at bedtime for 7 days  Then 1 capsule twice daily for 7 days  Then 1 capsule three times daily If you are doing well at a lower dose, you don't have to continue increasing the dose Contact us when you need a refill and let us know if you think we need to increase the dose

## 2016-12-11 ENCOUNTER — Other Ambulatory Visit: Payer: Self-pay

## 2016-12-11 MED ORDER — FUROSEMIDE 40 MG PO TABS: 20.0000 mg | ORAL_TABLET | Freq: Every day | ORAL | 0 refills | Status: DC

## 2016-12-11 NOTE — Telephone Encounter (Signed)
Rx(s) sent to pharmacy electronically.  

## 2017-03-13 ENCOUNTER — Ambulatory Visit (INDEPENDENT_AMBULATORY_CARE_PROVIDER_SITE_OTHER): Payer: Medicare Other | Admitting: Neurology

## 2017-03-13 ENCOUNTER — Encounter: Payer: Self-pay | Admitting: Neurology

## 2017-03-13 VITALS — BP 134/58 | HR 92 | Ht 59.0 in | Wt 135.2 lb

## 2017-03-13 DIAGNOSIS — M792 Neuralgia and neuritis, unspecified: Secondary | ICD-10-CM | POA: Diagnosis not present

## 2017-03-13 NOTE — Progress Notes (Signed)
NEUROLOGY FOLLOW UP OFFICE NOTE  Julia Lucero [id number]  HISTORY OF PRESENT ILLNESS: Julia Lucero is a 81 year old female with COPD, hypertension and history of Rickets who follows up for atypical left sided facial pain and left arm pain.  She is accompanied by her husband who supplements history.  UPDATE: She never started the gabapentin due to concerns of potential side effects. The numbness is unchanged.   HISTORY: In September 2017, she received the pneumonia shot in her left upper arm.  Afterwards, she developed a pain and numbness that radiated down the hand (not the fingers) and up to the scapula and posterior neck, as well as in the left lower face (radiating across her mouth).  Initially, she had some tingling in the left lower face but now only endorses dull pressure-like discomfort involving the left side of her upper teeth and maxilla.  She denies paroxysmal shooting pain or pain aggravated by chewing, brushing teeth or talking.  She was prescribed carbamazepine but never started it.  She saw the dentist and underwent a negative workup.  She still has a dull aching in her left arm and possibly some tingling.  She denies neck pain, radicular pain down the arm or weakness.  For further evaluation, she had an MRI of the brain without contrast (only axial diffusion-weighted sequence performed) performed on 07/21/16, which was unremarkable.  PAST MEDICAL HISTORY: Past Medical History:  Diagnosis Date  . Allergy   . Chronic fatigue   . COPD (chronic obstructive pulmonary disease) ([place])   . Disorder of bone and cartilage, unspecified   . Hypertension   . Oxygen deficiency   . Oxygen dependent   . Pulmonary hypertension ([place])   . Restrictive lung disease   . Rickettsia infection     MEDICATIONS: Current Outpatient Prescriptions on File Prior to Visit  Medication Sig Dispense Refill  . amLODipine (NORVASC) 5 MG tablet TAKE 1 TABLET(5 MG) BY MOUTH DAILY 90 tablet 3  .  furosemide (LASIX) 40 MG tablet Take 0.5 tablets (20 mg total) by mouth daily. 45 tablet 0  . gabapentin (NEURONTIN) 100 MG capsule Take 1 capsule at bedtime for 7 days, then 1 capsule twice daily for 7 days, then 1 capsule three times daily (Patient not taking: Reported on 03/13/2017) 90 capsule 0  . Multiple Vitamin (MULTIVITAMIN) capsule Take 1 capsule by mouth daily.      . OXYGEN-HELIUM IN Inhale 2 L into the lungs continuous.    . valACYclovir (VALTREX) 1000 MG tablet Take 2 pills (2000 mg) at the first sign of cold sore, repeat 2 pills 12 hours later 20 tablet 0   No current facility-administered medications on file prior to visit.     ALLERGIES: Allergies  Allergen Reactions  . Methocarbamol     Altered thought process  . Penicillins   . Sulfonamide Derivatives   . Amoxicillin Rash    FAMILY HISTORY: Family History  Problem Relation Age of Onset  . Diabetes Father   . Heart disease Father   . Hyperlipidemia Brother   . Arthritis Sister   . Colon cancer Mother   . Heart disease Mother   . Hypertension Mother   . Heart disease Maternal Grandfather   . Heart disease Paternal Grandmother   . Heart disease Paternal Grandfather   . Hyperlipidemia Brother   . Hypertension Brother     SOCIAL HISTORY: Social History   Social History  . Marital status: Married    Spouse name: N/A  .  Number of children: 2  . Years of education: 4   Occupational History  . Not on file.   Social History Main Topics  . Smoking status: Never Smoker  . Smokeless tobacco: Never Used  . Alcohol use 3.6 oz/week    6 Standard drinks or equivalent per week     Comment: Rarely  . Drug use: No  . Sexual activity: No   Other Topics Concern  . Not on file   Social History Narrative   Fun: Read, exercise, keep up with family   Denies abuse and feels safe at home.     REVIEW OF SYSTEMS: Constitutional: No fevers, chills, or sweats, no generalized fatigue, change in appetite Eyes: No  visual changes, double vision, eye pain Ear, nose and throat: No hearing loss, ear pain, nasal congestion, sore throat Cardiovascular: No chest pain, palpitations Respiratory:  No shortness of breath at rest or with exertion, wheezes GastrointestinaI: No nausea, vomiting, diarrhea, abdominal pain, fecal incontinence Genitourinary:  No dysuria, urinary retention or frequency Musculoskeletal:  No neck pain, back pain Integumentary: No rash, pruritus, skin lesions Neurological: as above Psychiatric: No depression, insomnia, anxiety Endocrine: No palpitations, fatigue, diaphoresis, mood swings, change in appetite, change in weight, increased thirst Hematologic/Lymphatic:  No purpura, petechiae. Allergic/Immunologic: no itchy/runny eyes, nasal congestion, recent allergic reactions, rashes  PHYSICAL EXAM: Vitals:   03/13/17 1404  BP: (!) 134/58  Pulse: 92  SpO2: 93%   General: No acute distress.  Patient appears well-groomed.  normal body habitus. Head:  Normocephalic/atraumatic  IMPRESSION: Atypical neuralgia/numbness involving the left side of face and proximal left upper extremity.  Presentation is not consistent with classic trigeminal neuralgia, cervical myelopathy, radiculopathy or mononeuropathy.  PLAN: After discussion about the indication and possible side effects of gabapentin, she is agreeable to trying it.  She will start 100mg  at bedtime.  She may increase to twice daily in one week if needed, and may increase to three times daily in another week if needed.  Follow up in 3 months.  17 minutes spent face to face with patient, 100% spent discussing diagnosis as well as benefits and possible side effects with gabapentin.  Metta Clines, DO  CC:  Lamar Blinks, MD

## 2017-03-13 NOTE — Patient Instructions (Signed)
1.  Start gabapentin 100mg .    Take 1 capsule at bedtime for 7 days,  Then 1 capsule in morning and 1 capsule at bedtime for 7 days,  Then 1 capsule three times daily  If you are doing well at a lower dose, remain at that dose.  If numbness not improved at 1 capsule three times daily, contact me  Side effect may include dizziness or drowsiness. 2.  Follow up in 3 months.

## 2017-03-20 ENCOUNTER — Other Ambulatory Visit: Payer: Self-pay | Admitting: Cardiovascular Disease

## 2017-03-20 NOTE — Telephone Encounter (Signed)
REFILL 

## 2017-06-24 ENCOUNTER — Telehealth: Payer: Self-pay | Admitting: Family Medicine

## 2017-06-24 NOTE — Telephone Encounter (Signed)
Attempted to call patient to schedule AWV. Julia Lucero did not answer. Will try to call patient at a later time. SF

## 2017-06-30 ENCOUNTER — Ambulatory Visit: Payer: Medicare Other | Admitting: Neurology

## 2017-08-07 NOTE — Progress Notes (Addendum)
Julia Lucero at [facility] [address], [place], [place] [address] [phone]  Date:  08/10/2017   Name:  Julia Lucero   DOB:  1935-06-16   MRN:  [id number]  PCP:  Darreld Mclean, MD    Chief Complaint: No chief complaint on file.   History of Present Illness:  [name] is a 82 y.o. very pleasant female patient who presents with the following:  History of pulmonary hypertension, dyspnea, kyphosis.   Here today with concern of a strange incident that occured a week ago today She has not been driving, wanted to get back at this and went to have her license renewed.  She was doing a road test with a [employer] employee She got in the car and was about to back out- however she had a momentary "lapse" and could not seem to remember to put the car in reverse vs could not get her hand to obey her head.   This lasted perhaps up to 5 seconds and then she was able to back out and passed the road test.   This has not happened before, no other similar incidents noted  Her husband notes that she is a bit more forgetful than in the past  -she recently asked a few times about where they ate the day prior.   However they have not had any real concerns about her memory No slurred speech or double vision, no swallowing difficulty She has chronic issues with pain and numbness in her left arm, but otherwise no concerns about any numbness or weakness of her limbs or body We had her see neurology for her left arm problem last fall, they tried gabapentin but she did not take it for long to due SE  She is exercising and overall feeling well.  She does not feel like her SOB is any worse than usual, no oxygen desats noted No CP or SOB  Dexa:  Will schedule for her  She did have an MRI of her brain about a year ago- it was normal.  She also had a CT of her head which shows chronic changes/ atrophy  MRI-  IMPRESSION: 1. Limited study with only axial  diffusion-weighted sequence performed. 2. No acute intracranial infarct identified. No other obvious intracranial abnormality on this limited exam.  CT- IMPRESSION: 1. No acute intracranial abnormality. 2. Atrophy and chronic microvascular white matter ischemic changes.  Patient Active Problem List   Diagnosis Date Noted  . Need for prophylactic vaccination against Streptococcus pneumoniae (pneumococcus) 03/14/2016  . Pectus excavatum 09/10/2015  . Seasonal and perennial allergic rhinitis 07/24/2015  . Urticaria 03/16/2015  . Angioedema 03/13/2015  . Other chest pain 01/18/2015  . Bibasilar crackles 09/08/2014  . Dyspnea and respiratory abnormality 09/08/2014  . Cor pulmonale, chronic ([place]) 09/08/2014  . Hx of adenomatous colonic polyps 07/29/2012  . History of rickets 07/27/2012  . KYPHOSIS ASSOCIATED WITH OTHER CONDITION 06/28/2010  . SHORTNESS OF BREATH (SOB) 09/11/2009  . Pulmonary hypertension ([place]) 09/10/2009  . OSTEOPENIA 09/10/2009    Past Medical History:  Diagnosis Date  . Allergy   . Chronic fatigue   . COPD (chronic obstructive pulmonary disease) ([place])   . Disorder of bone and cartilage, unspecified   . Hypertension   . Oxygen deficiency   . Oxygen dependent   . Pulmonary hypertension ([place])   . Restrictive lung disease   . Rickettsia infection     Past Surgical History:  Procedure Laterality Date  . OOPHORECTOMY      Social History   Tobacco Use  . Smoking status: Never Smoker  . Smokeless tobacco: Never Used  Substance Use Topics  . Alcohol use: Yes    Alcohol/week: 3.6 oz    Types: 6 Standard drinks or equivalent per week    Comment: Rarely  . Drug use: No    Family History  Problem Relation Age of Onset  . Diabetes Father   . Heart disease Father   . Hyperlipidemia Brother   . Arthritis Sister   . Colon cancer Mother   . Heart disease Mother   . Hypertension Mother   . Heart disease Maternal Grandfather   . Heart disease Paternal  Grandmother   . Heart disease Paternal Grandfather   . Hyperlipidemia Brother   . Hypertension Brother     Allergies  Allergen Reactions  . Methocarbamol     Altered thought process  . Penicillins   . Sulfonamide Derivatives   . Amoxicillin Rash    Medication list has been reviewed and updated.  Current Outpatient Medications on File Prior to Visit  Medication Sig Dispense Refill  . amLODipine (NORVASC) 5 MG tablet TAKE 1 TABLET(5 MG) BY MOUTH DAILY 90 tablet 3  . furosemide (LASIX) 40 MG tablet TAKE 1/2 TABLET BY MOUTH DAILY 45 tablet 3  . Multiple Vitamin (MULTIVITAMIN) capsule Take 1 capsule by mouth daily.      . OXYGEN-HELIUM IN Inhale 2 L into the lungs continuous.    . valACYclovir (VALTREX) 1000 MG tablet Take 2 pills (2000 mg) at the first sign of cold sore, repeat 2 pills 12 hours later 20 tablet 0  . gabapentin (NEURONTIN) 100 MG capsule Take 1 capsule at bedtime for 7 days, then 1 capsule twice daily for 7 days, then 1 capsule three times daily (Patient not taking: Reported on 08/10/2017) 90 capsule 0   No current facility-administered medications on file prior to visit.     Review of Systems:  As per HPI- otherwise negative.  BP Readings from Last 3 Encounters:  08/10/17 (!) 112/58  03/13/17 (!) 134/58  12/05/16 106/60    Physical Examination: Vitals:   08/10/17 1217  BP: (!) 112/58  Pulse: 81  Resp: 14  Temp: 98.1 F (36.7 C)  SpO2: 98%   Vitals:   08/10/17 1217  Weight: 130 lb 6.4 oz (59.1 kg)  Height: 4\' 11"  (1.499 m)   Body mass index is 26.34 kg/m. Ideal Body Weight: Weight in (lb) to have BMI = 25: 123.5  GEN: WDWN, NAD, Non-toxic, A & O x 3, looks well, here today with her husband  Using her oxygen concentrator as is normal for her  HEENT: Atraumatic, Normocephalic. Neck supple. No masses, No LAD.  Bilateral TM wnl, oropharynx normal.  PEERL,EOMI.   Ears and Nose: No external deformity. CV: RRR, No M/G/R. No JVD. No thrill. No extra  heart sounds. PULM: CTA B, no wheezes, crackles, rhonchi. No retractions. No resp. distress. No accessory muscle use. ABD: S, NT, ND, +BS. No rebound. No HSM. EXTR: No c/c/e NEURO Normal gait.  PSYCH: Normally interactive. Conversant. Not depressed or anxious appearing.  Calm demeanor.  Normal strength, sensation and DTR of all limbs She is able to name the day, date, season, year, place, and common objects.  She is able to subtract serial 3s   Assessment and Plan: Memory disturbance - Plan: CBC, Comprehensive metabolic panel, Lipid panel, TSH  Estrogen deficiency -  Plan: DG Bone Density  Essential hypertension - Plan: Lipid panel  History of rickets - Plan: Vitamin D (25 hydroxy)  Vitamin D deficiency - Plan: Vitamin D (25 hydroxy)  Here today with concern about her memory Offered to have her see neurology about this- for the time being she declines We will obtain labs for her as above Also will schedule a dexa scan for her  I do not think her sx represent a TIA  Signed Lamar Blinks, MD  Received her labs 3/5  Your labs are overall quite good Blood counts are normal Metabolic profile is normal- your CO2 is a bit high, but this goes with your lung problems Cholesterol is very good Thyroid is normal Your vitamin D is a bit low- I would recommend that you start an OTC vitamin D with 2,000 IU of vitamin D daily  Let me know if you have any other concerns.  If all is well, let's visit in 6 months.   Results for orders placed or performed in visit on 08/10/17  CBC  Result Value Ref Range   WBC 6.1 4.0 - 10.5 K/uL   RBC 4.16 3.87 - 5.11 Mil/uL   Platelets 256.0 150.0 - 400.0 K/uL   Hemoglobin 13.4 12.0 - 15.0 g/dL   HCT 39.8 36.0 - 46.0 %   MCV 95.6 78.0 - 100.0 fl   MCHC 33.8 30.0 - 36.0 g/dL   RDW 12.7 11.5 - 15.5 %  Comprehensive metabolic panel  Result Value Ref Range   Sodium 141 135 - 145 mEq/L   Potassium 3.9 3.5 - 5.1 mEq/L   Chloride 98 96 - 112 mEq/L    CO2 36 (H) 19 - 32 mEq/L   Glucose, Bld 107 (H) 70 - 99 mg/dL   BUN 14 6 - 23 mg/dL   Creatinine, Ser 0.69 0.40 - 1.20 mg/dL   Total Bilirubin 0.8 0.2 - 1.2 mg/dL   Alkaline Phosphatase 76 39 - 117 U/L   AST 29 0 - 37 U/L   ALT 29 0 - 35 U/L   Total Protein 7.2 6.0 - 8.3 g/dL   Albumin 4.2 3.5 - 5.2 g/dL   Calcium 9.9 8.4 - 10.5 mg/dL   GFR 87.00 >60.00 mL/min  Lipid panel  Result Value Ref Range   Cholesterol 174 0 - 200 mg/dL   Triglycerides 85.0 0.0 - 149.0 mg/dL   HDL 49.00 >39.00 mg/dL   VLDL 17.0 0.0 - 40.0 mg/dL   LDL Cholesterol 108 (H) 0 - 99 mg/dL   Total CHOL/HDL Ratio 4    NonHDL 125.20   TSH  Result Value Ref Range   TSH 1.76 0.35 - 4.50 uIU/mL  Vitamin D (25 hydroxy)  Result Value Ref Range   VITD 23.59 (L) 30.00 - 100.00 ng/mL

## 2017-08-10 ENCOUNTER — Ambulatory Visit (INDEPENDENT_AMBULATORY_CARE_PROVIDER_SITE_OTHER): Payer: Medicare Other | Admitting: Family Medicine

## 2017-08-10 ENCOUNTER — Encounter: Payer: Self-pay | Admitting: Family Medicine

## 2017-08-10 VITALS — BP 112/58 | HR 81 | Temp 98.1°F | Resp 14 | Ht 59.0 in | Wt 130.4 lb

## 2017-08-10 DIAGNOSIS — E2839 Other primary ovarian failure: Secondary | ICD-10-CM

## 2017-08-10 DIAGNOSIS — I10 Essential (primary) hypertension: Secondary | ICD-10-CM | POA: Diagnosis not present

## 2017-08-10 DIAGNOSIS — R413 Other amnesia: Secondary | ICD-10-CM

## 2017-08-10 DIAGNOSIS — E559 Vitamin D deficiency, unspecified: Secondary | ICD-10-CM | POA: Diagnosis not present

## 2017-08-10 DIAGNOSIS — Z8639 Personal history of other endocrine, nutritional and metabolic disease: Secondary | ICD-10-CM | POA: Diagnosis not present

## 2017-08-10 LAB — COMPREHENSIVE METABOLIC PANEL
ALK PHOS: 76 U/L (ref 39–117)
ALT: 29 U/L (ref 0–35)
AST: 29 U/L (ref 0–37)
Albumin: 4.2 g/dL (ref 3.5–5.2)
BILIRUBIN TOTAL: 0.8 mg/dL (ref 0.2–1.2)
BUN: 14 mg/dL (ref 6–23)
CO2: 36 mEq/L — ABNORMAL HIGH (ref 19–32)
Calcium: 9.9 mg/dL (ref 8.4–10.5)
Chloride: 98 mEq/L (ref 96–112)
Creatinine, Ser: 0.69 mg/dL (ref 0.40–1.20)
GFR: 87 mL/min (ref >60.00)
Glucose, Bld: 107 mg/dL — ABNORMAL HIGH (ref 70–99)
Potassium: 3.9 mEq/L (ref 3.5–5.1)
SODIUM: 141 meq/L (ref 135–145)
TOTAL PROTEIN: 7.2 g/dL (ref 6.0–8.3)

## 2017-08-10 LAB — LIPID PANEL
Cholesterol: 174 mg/dL (ref 0–200)
HDL: 49 mg/dL (ref >39.00)
LDL Cholesterol: 108 mg/dL — ABNORMAL HIGH (ref 0–99)
NONHDL: 125.2
Total CHOL/HDL Ratio: 4
Triglycerides: 85 mg/dL (ref 0.0–149.0)
VLDL: 17 mg/dL (ref 0.0–40.0)

## 2017-08-10 LAB — CBC
HCT: 39.8 % (ref 36.0–46.0)
HEMOGLOBIN: 13.4 g/dL (ref 12.0–15.0)
MCHC: 33.8 g/dL (ref 30.0–36.0)
MCV: 95.6 fl (ref 78.0–100.0)
Platelets: 256 10*3/uL (ref 150.0–400.0)
RBC: 4.16 Mil/uL (ref 3.87–5.11)
RDW: 12.7 % (ref 11.5–15.5)
WBC: 6.1 10*3/uL (ref 4.0–10.5)

## 2017-08-10 LAB — TSH: TSH: 1.76 u[IU]/mL (ref 0.35–4.50)

## 2017-08-10 LAB — VITAMIN D 25 HYDROXY (VIT D DEFICIENCY, FRACTURES): VITD: 23.59 ng/mL — ABNORMAL LOW (ref 30.00–100.00)

## 2017-08-10 NOTE — Patient Instructions (Signed)
It was very nice to see you today- take care and I will be in touch with your labs asap Please let me know if you have any other concerning incidents. We can always have you see neurology again for more detailed memory testing.

## 2017-08-11 ENCOUNTER — Encounter: Payer: Self-pay | Admitting: Family Medicine

## 2017-08-14 ENCOUNTER — Emergency Department (HOSPITAL_COMMUNITY)
Admission: EM | Admit: 2017-08-14 | Discharge: 2017-08-14 | Disposition: A | Discharge status: Home / Self Care | Payer: Medicare Other | Attending: Emergency Medicine | Admitting: Emergency Medicine

## 2017-08-14 DIAGNOSIS — R410 Disorientation, unspecified: Secondary | ICD-10-CM | POA: Insufficient documentation

## 2017-08-14 DIAGNOSIS — Z5321 Procedure and treatment not carried out due to patient leaving prior to being seen by health care provider: Secondary | ICD-10-CM | POA: Insufficient documentation

## 2017-08-14 DIAGNOSIS — R42 Dizziness and giddiness: Secondary | ICD-10-CM | POA: Insufficient documentation

## 2017-09-15 ENCOUNTER — Telehealth: Payer: Self-pay | Admitting: Internal Medicine

## 2017-09-15 NOTE — Telephone Encounter (Signed)
MR- handicap placard placed in your review folder. Please advise when signed.   Patient Instructions by Brand Males, MD at 10/14/2016 10:45 AM  Author: Brand Males, MD Author Type: Physician Filed: 10/14/2016 11:26 AM  Note Status: Signed Cosign: Cosign Not Required Encounter Date: 10/14/2016  Editor: Brand Males, MD (Physician)      ICD-9-CM ICD-10-CM   1. Dyspnea and respiratory abnormality 786.09 R06.00     R06.89   2. Pulmonary hypertension (HCC) 416.8 I27.20     Stable disease  Plan - walk test on room air in office - use oxygen as before  Followup 1 year or sooner if needed

## 2017-09-21 NOTE — Telephone Encounter (Signed)
Dr. Chase Caller will be back in the office on 09/28/17. Form will be addressed then.

## 2017-09-30 NOTE — Telephone Encounter (Signed)
Julia Lucero please follow up on this.

## 2017-09-30 NOTE — Telephone Encounter (Signed)
[employer] application has been filled out and signed by MR    Called and spoke with pt letting her know application was ready for her to pick up. Pt stated she would come by sometime tomorrow to pick application up.  Application has been placed up front in brown accordion folder.  Nothing further needed at this time.

## 2017-09-30 NOTE — Telephone Encounter (Signed)
Folder of papers that need to be reviewed/signed by MR is in his office.  Once form has been signed by MR and once folder has been handed back to me, will call pt stating form is ready.

## 2017-11-17 ENCOUNTER — Encounter: Payer: Self-pay | Admitting: Internal Medicine

## 2017-11-17 ENCOUNTER — Ambulatory Visit (INDEPENDENT_AMBULATORY_CARE_PROVIDER_SITE_OTHER): Payer: Medicare Other | Admitting: Internal Medicine

## 2017-11-17 VITALS — BP 108/64 | HR 58 | Ht 59.0 in | Wt 126.4 lb

## 2017-11-17 DIAGNOSIS — Q676 Pectus excavatum: Secondary | ICD-10-CM | POA: Diagnosis not present

## 2017-11-17 DIAGNOSIS — I272 Pulmonary hypertension, unspecified: Secondary | ICD-10-CM

## 2017-11-17 DIAGNOSIS — R0600 Dyspnea, unspecified: Secondary | ICD-10-CM

## 2017-11-17 DIAGNOSIS — R0689 Other abnormalities of breathing: Secondary | ICD-10-CM

## 2017-11-17 NOTE — Progress Notes (Signed)
Subjective:     Patient ID: Julia Lucero, female   DOB: May 16, 1936, 82 y.o.   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 Decompensated Cor Pulmonale and discovery of pulmonary hypertension on ECHO (no right heart cath) following trip to [facility] in March 2011 due to pneumonia (CT chest  march 2011 at [place] consolidation and bialteral effusion). Negative Autoimmune Profile May 2011 except for mild raised CRP and borderline positive rheumatoid factor   March 04, 2010: feels well. Doing rehab. Not desaturating there. Due to fly to [facility] 10/20/2011on [employer]. Lot of questions on  flight o2. WE walked her today 185 feet x 3 laps and lowest o2 was only 92%. This is a huge improvement because in MAy 2011 she did desaturate with exertion. No new complaints. She continues on lasix and amlodipine started by Dr. Gwenlyn Found  in Apri 2011. Compliant with medicines. No interim problems. Denies edema, cough, orthopnea, paroxysmal nocturnal dyspnea, wheeze. She has seen Dr. Gwenlyn Found in interim and per his records he has resasured her and is following expectantly. REC: EXPECTANT FOLLOWUP  OV 11/01/2010: OVerall well. Has completed rehab. Underwent PT for muscle strengthening but now doing it on own. Unclear if has increasing dyspnea or not. AT times she denies it but at times she states she is marginally worse. Certainly seems to get tired by end of the day which might be baseline. Uses o2 wwiht exertion.  Continues lasix and amlodipine. Not seen Dr. Gwenlyn Found in interim either. No worsening edema. No wheeze. No chest pain. No syncope. No weight loss. No pnd. No orthopnea. Had 3 flight trips in interim and did well. Has repeat question on mechanism of restrictive chest disease and pulmonary hypertension. Walking desaturatin test today - and dropped to 88% - 87% after 2 laps  REC  Please continue daily exercise - make  sure heart rate is < 120-125 - you can take your heart rate that high but not higher  Make sure pulse ox is always >88% with or without oxygen  Use oxygen with exercise and walking and moving around  We will set up overnight oxygen study on room air to determine your oxygen need at night  Please see Dr. Gwenlyn Found as well  REturn to see me in 6 months  Come sooner if there are problems  OV 08/04/2011  OVerall well. Doing exercises 5-6 times per week in 2 sessions daily. Really helping. Overall stable. Class 2 dyspnea with exertion that is stable and relieved by rest. Uses o2 wwiht exertion.  Continues lasix and amlodipine. Not seen Dr. Gwenlyn Found in interim either. No worsening edema. No wheeze. No chest pain. No syncope. No weight loss. No pnd. No orthopnea. Had several flight trips in interim to [place] and did well; does DVT precaution exercises. Walking desaturatin test today - and dropped to 88% - 87% after 1 laps x 185 feet. The exertional desaturation is marginally worse than last OV. We will keep an eye on this because she is otherwise well  Past, Family, Social reviewed: no change since last visit   Please continue daily exercise - make sure heart rate is < 120-125 - you can take your heart rate that high but not higher  Make sure pulse ox is always >88% with or without oxygen  Use oxygen with exercise and walking and moving around  REturn to see me in 8 Months. Wil monitor her exertional desaturations  Come sooner  if there are problems   OV 03/30/2012 Resp wise stable. Says she feels hollow inside. Unable to explain. Spiritually weary. No other iesues. Walking desaturation test on 03/30/2012 185 feet x 1 laps:  did desaturate. Rest pulse ox was 94%, final pulse ox was 88%. HR response 93/min at rest to 114/min at peak exertion at 1 laps. The distance to desaturation is similar to Feb 2013 but worse since May 2012  [facility] Ensure heart ok with Dr Gwenlyn Found   10/20/2012 Office Visit  followup   related restrictive cage defect related to rickets   - Last seen in December 2013. After that for my recommendation she did visit cardiology Dr. Gwenlyn Found. She and husband recollect having an echocardiogram that is reported as normal per her history. Overall she's stable although she feels that over the span of 2 years she's is a little bit more fatigued than baseline especially early in the morning. She's not sure if she's had her vitamin D and thyroid function tests checked. Also of note, she went to [place] by car and return a few weeks ago. She did take several breaks every few hours and stretch the legs. Upon return she had mild pedal edema that lasted 2 days and then resolved spontaneously. There was no warmth of the leg or hemoptysis or chest pain or undue dyspnea. It is noted that she does have bilateral varicose veins. No other health issues  - Walk test today 10/20/2012: 185 feet x3 laps on room air: Pulse oximetry 86% at the third lap with a heart rate of 107   Past, Family, Social reviewed: no change since last visit    #Respiratory  - clinically you are stable with good oxygen levels for 550 feet of exertion  - suspect some deconditioning over winter; so will re-refer to pulmonary rehabilitation - continue current care  #Fatigue - talk to ARONSON,RICHARD A, MD about checking vitamin D and thyroid levels  #Followup - 9 months or sooner if needed  OV 09/29/2013  Chief Complaint  Patient presents with  . Follow-up    pt c/o SOB with exertion.     followup  related restrictive cage defect related to rickets with associated pulmonary hypertension  - Last seen in May 2014. Since then overall stable. Last visit she only desaturated on room air after walking 185 feet x3 laps. However, she continues to use 2 L nasal cannula are 24 hours. She's feels that she needs this. But otherwise he does be that she's not any worse or any better. She does say that her travel plans are unchanged but  as she ages she finds that it's more complicated to organize do to dependency her oxygen. Is no worsening edema or chest pain or cough or sputum production.  - Walk test on room air her and made her feet x3 laps: At rest her heart rate was 76 per minute. Pulse ox is 96 a minute. After exertion 185 feet x1 lap she desaturated down to 87% and she stayed down at 87%. Peak heart rate was 101. Therefore exercise was terminated. This is similar to ? Worse than a year ago  - She is somewhat anxious about her long term future. We discussed [place] referral to pulm htn clinic and she is open to doing that  Social history: Husband suffered is herniation recently and is on conservative treatment     OV 09/08/2014  Chief Complaint  Patient presents with  . Follow-up    Pt stated she  was feeling well until catching cold. Pt c/o sinus congestion and cough. Pt currently taking levaquin. Pt c/o dry cough. Pt denies increase in SOB and CP/tightness.     Annual follow-up childhood rickets associated with thoracic cage defect associated with clinical pulmonary hypertension /cor pulmolane based on echo. Clinically she suffers from exertional hypoxemia.   I have been following Mrs. Mccomber for many years. The last 1 year she reports has been stable. In the interim in summer 2015 she did go to [employer] pulmonary hypertension program. According to her history she was reassured that problems are related to thoracic cage defect and she was discharged from follow-up She does not recollect having had a right heart catheterization though chart review at [facility] suggest that she had it but I'm unable to access these results. She did have a VQ scan which was determined is a low probability for pulmonary embolism. She did have a chest x-ray that showed enlarged cardiac silhouette along with pectus excavatum. And also thoracic scoliosis. Currently overall she is doing well but a week ago she did pick up a cold and she is on a  ten-day Levaquin. Today Walking desaturation test 185 feet 3 laps on room air: She walked all 3 laps and at the very end dropped to 88%. This is probably better than a year ago   of note, exam shows bibasal crackles which I believe might be new for her    OV 09/10/2015  Chief Complaint  Patient presents with  . Follow-up    Pt here for 1 year f/u. Pt states her breathing is doing well and she states she feels stronger. Pt denies cough, wheezing, CP/tightness, and swelling.     Annual follow-up childhood rickets associated with thoracic cage defect associated with clinical pulmonary hypertension /cor pulmolane based on echo. Clinically she suffers from exertional hypoxemia.    This is an annual follow-up. Last seen April 2016. In the interim she developed tongue swelling or lip swelling with hot sauce. She saw Dr. Annamaria Boots for this. She's been reassured. Overall she's feeling well. She has been to [place] bunch of times to see the grandchildren. Most recent trip was a few weeks ago. She feels stronger. She has less exertional dyspnea than a year ago. She does aerobic work associated with light weight training of her upper extremities. There are no new issues. Walking desaturation test 185 feet 3 laps on room air: 1 lap and desat to84% (baseine 2 years ago is 87%)  So stable  I did do high resolution CT chest April 2016 for crackles on her chest exam but was clear without any interstitial lung disease.    OV 10/14/2016  Chief Complaint  Patient presents with  . Follow-up    Pt here for 12 month f/u. Pt states her SOB is doing well. Pt denies cough, CP/tightness, and f/c/s.     Annual follow-up in childhood rickets associated with thoracic cage defect associated with clinical pulmonary hypertension/cor pulmonale based on echo. Clinically she suffers from exertional hypoxemia  This is a one year follow-up. This year she did see her cardiologist Dr. Alvester Chou and his been reassured. She  continues to travel to [place]. She has no problems. She uses oxygen only with exertion and at daytime at rest. She does not use nocturnal oxygen. She says that as she ages she is noticing thoracic restriction on her breathing but otherwise feels good. She continues with aerobic exercises and walking desaturation test 185 feet 3 laps  on room air: 94% at rest, ended at 91% after 3rd lap. Improved   OV 11/17/2017  Chief Complaint  Patient presents with  . Follow-up    1-year follow up visit.  Pt states she has been doing well since last visit. Pt has had some tingling in a couple of her fingers on left hand. DME: [employer], 2L.    Annual follow-up in childhood rickets associated with thoracic cage defect (pectus) associated with clinical pulmonary hypertension/cor pulmonale based on echo. Clinically she suffers from exertional hypoxemia    82 year old female with the above problem.  Presents for one-year follow-up.  In terms of her dyspnea and health status overall she is stable.  She says she uses oxygen as needed at night and at daytime she uses it continuously.  She does not have any worsening dyspnea.  She does not monitor her pulse ox.  She uses oxygen for a travel as well.  Review of the chart indicates that probably in October 2017 she got a pneumonia vaccine on her left deltoid area and since then she is reporting on and off tingling.  In fact she called [place] about it a year ago.  This happened in another office.  There is no neurologic deficit as such.  This seems to bother her more than other symptoms.  Walking desaturation test today did not show any desaturations on room air.  Review of the chart indicates last imaging chest x-ray CT scan was in 2016 which showed her pectus excavatum   Simple office walk 185 feet x  3 laps goal with forehead probe 11/17/2017   O2 used Room air  Number laps completed All 3  Comments about pace Slow pace   Resting Pulse Ox/HR 97% and 69/min  Final Pulse  Ox/HR 91% and 94/min  Desaturated </= 88% no  Desaturated <= 3% points yes  Got Tachycardic >/= 90/min yes  Symptoms at end of test No compalints  Miscellaneous comments none      has a past medical history of Allergy, Chronic fatigue, COPD (chronic obstructive pulmonary disease) (HCC), Disorder of bone and cartilage, unspecified, Hypertension, Oxygen deficiency, Oxygen dependent, Pulmonary hypertension ([place]), Restrictive lung disease, and Rickettsia infection.   reports that she has never smoked. She has never used smokeless tobacco.  Past Surgical History:  Procedure Laterality Date  . OOPHORECTOMY      Allergies  Allergen Reactions  . Methocarbamol     Altered thought process  . Penicillins   . Sulfonamide Derivatives   . Amoxicillin Rash    Immunization History  Administered Date(s) Administered  . Influenza Split 03/12/2011, 03/30/2012  . Influenza Whole 03/04/2010  . Influenza, High Dose Seasonal PF 04/17/2016  . Influenza, Seasonal, Injecte, Preservative Fre 04/26/2014  . Influenza,inj,Quad PF,6+ Mos 03/09/2013, 05/28/2015  . Pneumococcal Conjugate-13 03/14/2016  . Pneumococcal Polysaccharide-23 06/10/2007, 03/09/2012    Family History  Problem Relation Age of Onset  . Diabetes Father   . Heart disease Father   . Hyperlipidemia Brother   . Arthritis Sister   . Colon cancer Mother   . Heart disease Mother   . Hypertension Mother   . Heart disease Maternal Grandfather   . Heart disease Paternal Grandmother   . Heart disease Paternal Grandfather   . Hyperlipidemia Brother   . Hypertension Brother      Current Outpatient Medications:  .  amLODipine (NORVASC) 5 MG tablet, TAKE 1 TABLET(5 MG) BY MOUTH DAILY, Disp: 90 tablet, Rfl: 3 .  furosemide (LASIX)  40 MG tablet, TAKE 1/2 TABLET BY MOUTH DAILY, Disp: 45 tablet, Rfl: 3 .  Multiple Vitamin (MULTIVITAMIN) capsule, Take 1 capsule by mouth daily.  , Disp: , Rfl:  .  OXYGEN-HELIUM IN, Inhale 2 L into the  lungs continuous., Disp: , Rfl:  .  valACYclovir (VALTREX) 1000 MG tablet, Take 2 pills (2000 mg) at the first sign of cold sore, repeat 2 pills 12 hours later (Patient not taking: Reported on 11/17/2017), Disp: 20 tablet, Rfl: 0   Review of Systems     Objective:   Physical Exam Today's Vitals   11/17/17 1116  BP: 108/64  Pulse: (!) 58  SpO2: 96%  Weight: 126 lb 6.4 oz (57.3 kg)  Height: 4\' 11"  (1.499 m)    Estimated body mass index is 25.53 kg/m as calculated from the following:   Height as of this encounter: 4\' 11"  (1.499 m).   Weight as of this encounter: 126 lb 6.4 oz (57.3 kg).   General Appearance:   pleasant femal3  Head:    Normocephalic, without obvious abnormality, atraumatic  Eyes:    PERRL - yes, conjunctiva/corneas - clear      Ears:    Normal external ear canals, both ears  Nose:   NG tube - no but has portable 02 on  Throat:  ETT TUBE - n , OG tube - no  Neck:   Supple,  No enlargement/tenderness/nodules     Lungs:     Clear to auscultation bilaterally,  Chest wall:    Pectus excavtum and chronic ricketts finding  Heart:    S1 and S2 normal, no murmur, CVP - no.  Pressors - no  Abdomen:     Soft, no masses, no organomegaly  Genitalia:    Not done  Rectal:   not done  Extremities:   Extremities- intact but has DJD,      Skin:   Intact in exposed areas .      Neurologic:   Sedation - none -> RASS - +1 . Moves all 4s - yes. CAM-ICU - neg . Orientation - x3+         Assessment:       ICD-10-CM   1. Dyspnea and respiratory abnormality R06.00    R06.89   2. Pulmonary hypertension (HCC) I27.20   3. Pectus excavatum Q67.6        Plan:      Overall stable x 1 year Last cxr/ct imaging in 2016  Plan - clinically no indication to get another cxr - continue o2 use portable at day and as needed for night as you are doing  - talk to PCP Copland, Gay Filler, MD about arm tingling following pneumonia shot in oct 2017 - flu shot in fall  followup 1  year or sooner if needed - simple walk test on room air at followup    Dr. Brand Males, M.D., [facility].C.P Pulmonary and Critical Care Medicine Staff Physician, [place] Director - Interstitial Lung Disease  Program  Pulmonary [place] [facility] [place], [place], [address]  Pager: [phone], If no answer or between  15:00h - 7:00h: call [phone] Telephone: [phone]

## 2017-11-17 NOTE — Patient Instructions (Signed)
Dyspnea and respiratory abnormality due to  Pulmonary hypertension (HCC) due to  Pectus excavatum due to  Childhood Ricketts    Overall stable x 1 year Last cxr/ct imaging in 2016  Plan - clinically no indication to get another cxr - continue o2 use portable at day and as needed for night as you are doing  - talk to PCP Copland, Gay Filler, MD about arm tingling following pneumonia shot in oct 2017 - flu shot in fall  followup 1 year or sooner if needed

## 2017-12-09 ENCOUNTER — Other Ambulatory Visit: Payer: Self-pay | Admitting: Cardiovascular Disease

## 2018-03-03 ENCOUNTER — Ambulatory Visit (HOSPITAL_BASED_OUTPATIENT_CLINIC_OR_DEPARTMENT_OTHER)
Admission: RE | Admit: 2018-03-03 | Discharge: 2018-03-03 | Disposition: A | Discharge status: Home / Self Care | Payer: Medicare Other | Source: Ambulatory Visit | Attending: Family Medicine | Admitting: Family Medicine

## 2018-03-03 ENCOUNTER — Encounter: Payer: Self-pay | Admitting: Family Medicine

## 2018-03-03 DIAGNOSIS — E2839 Other primary ovarian failure: Secondary | ICD-10-CM | POA: Diagnosis not present

## 2018-03-03 DIAGNOSIS — M85852 Other specified disorders of bone density and structure, left thigh: Secondary | ICD-10-CM | POA: Diagnosis not present

## 2018-03-03 DIAGNOSIS — Z13820 Encounter for screening for osteoporosis: Secondary | ICD-10-CM | POA: Insufficient documentation

## 2018-03-26 NOTE — Telephone Encounter (Signed)
error 

## 2018-04-14 DIAGNOSIS — H3122 Choroidal dystrophy (central areolar) (generalized) (peripapillary): Secondary | ICD-10-CM | POA: Diagnosis not present

## 2018-04-14 DIAGNOSIS — H524 Presbyopia: Secondary | ICD-10-CM | POA: Diagnosis not present

## 2018-04-14 DIAGNOSIS — H2511 Age-related nuclear cataract, right eye: Secondary | ICD-10-CM | POA: Diagnosis not present

## 2018-04-14 DIAGNOSIS — H5213 Myopia, bilateral: Secondary | ICD-10-CM | POA: Diagnosis not present

## 2018-04-14 DIAGNOSIS — H2512 Age-related nuclear cataract, left eye: Secondary | ICD-10-CM | POA: Diagnosis not present

## 2018-06-10 ENCOUNTER — Other Ambulatory Visit: Payer: Self-pay | Admitting: Cardiovascular Disease

## 2018-07-20 ENCOUNTER — Other Ambulatory Visit: Payer: Self-pay | Admitting: Cardiovascular Disease

## 2018-12-01 ENCOUNTER — Ambulatory Visit: Payer: Self-pay | Admitting: Family Medicine

## 2018-12-01 NOTE — Telephone Encounter (Signed)
Pt called in c/o dull abd pain/ache.   She wasn't very clear in her explanations.  Her husband was in the background assisting her answering questions.  I transferred the call to the office to [facility] however the pt had hung up.   So Julia Lucero is going to call her.  I sent my triage notes to the office. Reason for Disposition . Age > 60 years  Answer Assessment - Initial Assessment Questions 1. LOCATION: "Where does it hurt?"      My stomach hurts in right upper side. 2. RADIATION: "Does the pain shoot anywhere else?" (e.g., chest, back)     Not spreading 3. ONSET: "When did the pain begin?" (e.g., minutes, hours or days ago)      Unable to tell me how long pain has been going on.  It's a dull pain. 4. SUDDEN: "Gradual or sudden onset?"     My husband said I've had a stomach for 10 days.    5. PATTERN "Does the pain come and go, or is it constant?"    - If constant: "Is it getting better, staying the same, or worsening?"      (Note: Constant means the pain never goes away completely; most serious pain is constant and it progresses)     - If intermittent: "How long does it last?" "Do you have pain now?"     (Note: Intermittent means the pain goes away completely between bouts)     It's persistent.   My husband is helping me answer these questions.   I'm only eating toast and cottage cheese.   6. SEVERITY: "How bad is the pain?"  (e.g., Scale 1-10; mild, moderate, or severe)   - MILD (1-3): doesn't interfere with normal activities, abdomen soft and not tender to touch    - MODERATE (4-7): interferes with normal activities or awakens from sleep, tender to touch    - SEVERE (8-10): excruciating pain, doubled over, unable to do any normal activities      It's just a dull pain. 7. RECURRENT SYMPTOM: "Have you ever had this type of abdominal pain before?" If so, ask: "When was the last time?" and "What happened that time?"      No  It's a dull ache. 8. CAUSE: "What do you think is causing the  abdominal pain?"     I don't know. 9. RELIEVING/AGGRAVATING FACTORS: "What makes it better or worse?" (e.g., movement, antacids, bowel movement)     Not tried any OTC medicines.    10. OTHER SYMPTOMS: "Has there been any vomiting, diarrhea, constipation, or urine problems?"       No vomiting.  Diarrhea once yesterday and normal BM this morning. 11. PREGNANCY: "Is there any chance you are pregnant?" "When was your last menstrual period?"       Not asked due to age.  Protocols used: ABDOMINAL PAIN [facility]

## 2019-01-10 ENCOUNTER — Other Ambulatory Visit: Payer: Self-pay

## 2019-01-16 ENCOUNTER — Emergency Department (HOSPITAL_COMMUNITY)
Admission: EM | Admit: 2019-01-16 | Discharge: 2019-01-17 | Disposition: A | Discharge status: Home / Self Care | Payer: Medicare Other | Attending: Emergency Medicine | Admitting: Emergency Medicine

## 2019-01-16 ENCOUNTER — Encounter (HOSPITAL_COMMUNITY): Payer: Self-pay | Admitting: Emergency Medicine

## 2019-01-16 ENCOUNTER — Other Ambulatory Visit: Payer: Self-pay

## 2019-01-16 DIAGNOSIS — I10 Essential (primary) hypertension: Secondary | ICD-10-CM | POA: Insufficient documentation

## 2019-01-16 DIAGNOSIS — R1111 Vomiting without nausea: Secondary | ICD-10-CM | POA: Diagnosis not present

## 2019-01-16 DIAGNOSIS — J449 Chronic obstructive pulmonary disease, unspecified: Secondary | ICD-10-CM | POA: Diagnosis not present

## 2019-01-16 DIAGNOSIS — Z79899 Other long term (current) drug therapy: Secondary | ICD-10-CM | POA: Diagnosis not present

## 2019-01-16 DIAGNOSIS — R112 Nausea with vomiting, unspecified: Secondary | ICD-10-CM | POA: Diagnosis not present

## 2019-01-16 DIAGNOSIS — R51 Headache: Secondary | ICD-10-CM | POA: Insufficient documentation

## 2019-01-16 DIAGNOSIS — R42 Dizziness and giddiness: Secondary | ICD-10-CM | POA: Diagnosis not present

## 2019-01-16 DIAGNOSIS — R110 Nausea: Secondary | ICD-10-CM | POA: Diagnosis not present

## 2019-01-16 DIAGNOSIS — R519 Headache, unspecified: Secondary | ICD-10-CM

## 2019-01-16 LAB — BASIC METABOLIC PANEL
Anion gap: 14 (ref 5–15)
BUN: 7 mg/dL — ABNORMAL LOW (ref 8–23)
CO2: 26 mmol/L (ref 22–32)
Calcium: 9.3 mg/dL (ref 8.9–10.3)
Chloride: 93 mmol/L — ABNORMAL LOW (ref 98–111)
Creatinine, Ser: 0.53 mg/dL (ref 0.44–1.00)
GFR calc Af Amer: >60 mL/min (ref >60)
GFR calc non Af Amer: >60 mL/min (ref >60)
Glucose, Bld: 102 mg/dL — ABNORMAL HIGH (ref 70–99)
Potassium: 3.9 mmol/L (ref 3.5–5.1)
Sodium: 133 mmol/L — ABNORMAL LOW (ref 135–145)

## 2019-01-16 LAB — CBC
HCT: 39.5 % (ref 36.0–46.0)
Hemoglobin: 13.2 g/dL (ref 12.0–15.0)
MCH: 32.3 pg (ref 26.0–34.0)
MCHC: 33.4 g/dL (ref 30.0–36.0)
MCV: 96.6 fL (ref 80.0–100.0)
Platelets: 225 10*3/uL (ref 150–400)
RBC: 4.09 MIL/uL (ref 3.87–5.11)
RDW: 11.9 % (ref 11.5–15.5)
WBC: 6.6 10*3/uL (ref 4.0–10.5)
nRBC: 0 % (ref 0.0–0.2)

## 2019-01-16 MED ORDER — SODIUM CHLORIDE 0.9% FLUSH: 3.0000 mL | Freq: Once | INTRAVENOUS | Status: DC

## 2019-01-16 NOTE — ED Triage Notes (Signed)
Pt BIB [facility], c/o sudden onset dizziness and nausea tonight. EMS states pt's husband recently diagnosed with cancer and they were discussing options for him when symptoms started. Per EMS, pt vomited and symptoms resolved. Family reports pt is in the process of being diagnosed with dementia.

## 2019-01-17 ENCOUNTER — Emergency Department (HOSPITAL_COMMUNITY): Payer: Medicare Other

## 2019-01-17 DIAGNOSIS — R112 Nausea with vomiting, unspecified: Secondary | ICD-10-CM | POA: Diagnosis not present

## 2019-01-17 DIAGNOSIS — R51 Headache: Secondary | ICD-10-CM | POA: Diagnosis not present

## 2019-01-17 LAB — URINALYSIS, ROUTINE W REFLEX MICROSCOPIC
Bacteria, UA: NONE SEEN
Bilirubin Urine: NEGATIVE
Glucose, UA: NEGATIVE mg/dL
Hgb urine dipstick: NEGATIVE
Ketones, ur: 20 mg/dL — AB
Nitrite: NEGATIVE
Protein, ur: 30 mg/dL — AB
Specific Gravity, Urine: 1.017 (ref 1.005–1.030)
pH: 8 (ref 5.0–8.0)

## 2019-01-17 MED ORDER — LIDOCAINE VISCOUS HCL 2 % MT SOLN
15.0000 mL | Freq: Once | OROMUCOSAL | Status: AC
  Administered 2019-01-17: 15 mL via ORAL
  Filled 2019-01-17: qty 15

## 2019-01-17 MED ORDER — ACETAMINOPHEN 325 MG PO TABS
650.0000 mg | ORAL_TABLET | Freq: Once | ORAL | Status: AC
  Administered 2019-01-17: 01:00:00 650 mg via ORAL
  Filled 2019-01-17: qty 2

## 2019-01-17 MED ORDER — ONDANSETRON 4 MG PO TBDP: 4.0000 mg | ORAL_TABLET | Freq: Three times a day (TID) | ORAL | 0 refills | Status: DC | PRN

## 2019-01-17 MED ORDER — PROCHLORPERAZINE MALEATE 5 MG PO TABS
10.0000 mg | ORAL_TABLET | Freq: Once | ORAL | Status: AC
  Administered 2019-01-17: 01:00:00 10 mg via ORAL
  Filled 2019-01-17: qty 2

## 2019-01-17 MED ORDER — ALUM & MAG HYDROXIDE-SIMETH 200-200-20 MG/5ML PO SUSP
30.0000 mL | Freq: Once | ORAL | Status: AC
  Administered 2019-01-17: 30 mL via ORAL
  Filled 2019-01-17: qty 30

## 2019-01-17 NOTE — Discharge Instructions (Signed)
Please return to the emergency department with any new or worsening symptoms. Follow up with your doctor for recheck in 2-3 days. Use Zofran for nausea as needed.

## 2019-01-17 NOTE — ED Provider Notes (Signed)
[facility] EMERGENCY DEPARTMENT Provider Note   CSN: [id number] Arrival date & time: 01/16/19  2219     History   Chief Complaint Chief Complaint  Patient presents with  . Dizziness    HPI Julia Lucero is a 83 y.o. female.     Patient with history of O2 dependent COPD, pulmonary HTN, presents with sudden onset of nausea and frontal headache while at home this evening around 8:00 pm. She denies history of headaches. No fever, congestion, sore throat, cough, diarrhea. She reports abdominal discomfort across the upper abdomen. No visual changes but she states she felt dizzy and describes this as "difficulty focusing". She had a normal day up until 8:00 pm. Husband is available for collateral information. He corroborates patient's given details. No mental status changes since onset of symptoms. He reports they had been discussing plan of care for him and his diagnosis of brain tumor when symptoms began.   The history is provided by the patient and the spouse. No language interpreter was used.  Dizziness Associated symptoms: headaches, nausea and vomiting   Associated symptoms: no chest pain, no diarrhea and no shortness of breath     Past Medical History:  Diagnosis Date  . Allergy   . Chronic fatigue   . COPD (chronic obstructive pulmonary disease) ([place])   . Disorder of bone and cartilage, unspecified   . Hypertension   . Oxygen deficiency   . Oxygen dependent   . Pulmonary hypertension ([place])   . Restrictive lung disease   . Rickettsia infection     Patient Active Problem List   Diagnosis Date Noted  . Need for prophylactic vaccination against Streptococcus pneumoniae (pneumococcus) 03/14/2016  . Pectus excavatum 09/10/2015  . Seasonal and perennial allergic rhinitis 07/24/2015  . Urticaria 03/16/2015  . Angioedema 03/13/2015  . Other chest pain 01/18/2015  . Bibasilar crackles 09/08/2014  . Dyspnea and respiratory abnormality 09/08/2014  . Cor  pulmonale, chronic ([place]) 09/08/2014  . Hx of adenomatous colonic polyps 07/29/2012  . History of rickets 07/27/2012  . KYPHOSIS ASSOCIATED WITH OTHER CONDITION 06/28/2010  . SHORTNESS OF BREATH (SOB) 09/11/2009  . Pulmonary hypertension ([place]) 09/10/2009  . OSTEOPENIA 09/10/2009    Past Surgical History:  Procedure Laterality Date  . OOPHORECTOMY       OB History   No obstetric history on file.      Home Medications    Prior to Admission medications   Medication Sig Start Date End Date Taking? Authorizing Provider  amLODipine (NORVASC) 5 MG tablet TAKE 1 TABLET(5 MG) BY MOUTH DAILY 07/20/18   Lorretta Harp, MD  furosemide (LASIX) 40 MG tablet TAKE 1/2 TABLET BY MOUTH DAILY 06/10/18   Lorretta Harp, MD  Multiple Vitamin (MULTIVITAMIN) capsule Take 1 capsule by mouth daily.      [provider]  OXYGEN-HELIUM IN Inhale 2 L into the lungs continuous.    [provider]  valACYclovir (VALTREX) 1000 MG tablet Take 2 pills (2000 mg) at the first sign of cold sore, repeat 2 pills 12 hours later Patient not taking: Reported on 11/17/2017 10/22/16   Copland, Gay Filler, MD    Family History Family History  Problem Relation Age of Onset  . Diabetes Father   . Heart disease Father   . Hyperlipidemia Brother   . Arthritis Sister   . Colon cancer Mother   . Heart disease Mother   . Hypertension Mother   . Heart disease Maternal Grandfather   .  Heart disease Paternal Grandmother   . Heart disease Paternal Grandfather   . Hyperlipidemia Brother   . Hypertension Brother     Social History Social History   Tobacco Use  . Smoking status: Never Smoker  . Smokeless tobacco: Never Used  Substance Use Topics  . Alcohol use: Yes    Alcohol/week: 6.0 standard drinks    Types: 6 Standard drinks or equivalent per week    Comment: Rarely  . Drug use: No     Allergies   Methocarbamol, Penicillins, Sulfonamide derivatives, and Amoxicillin   Review of Systems  Review of Systems  Constitutional: Negative for chills and fever.  HENT: Negative.  Negative for congestion and sore throat.   Eyes: Negative for visual disturbance.  Respiratory: Negative.  Negative for cough and shortness of breath.   Cardiovascular: Negative.  Negative for chest pain.  Gastrointestinal: Positive for abdominal pain, nausea and vomiting. Negative for diarrhea.  Genitourinary: Negative.   Musculoskeletal: Negative.  Negative for myalgias.  Skin: Negative.   Neurological: Positive for dizziness (See HPI.) and headaches. Negative for speech difficulty.     Physical Exam Updated Vital Signs BP (!) 175/62 (BP Location: Right Arm)   Pulse 72   Temp 98.1 F (36.7 C) (Oral)   Resp 16   SpO2 94%   Physical Exam Vitals signs and nursing note reviewed.  Constitutional:      General: She is not in acute distress.    Appearance: She is well-developed.     Comments: Frail in appearance  HENT:     Head: Normocephalic.     Mouth/Throat:     Mouth: Mucous membranes are moist.  Eyes:     Conjunctiva/sclera: Conjunctivae normal.  Neck:     Musculoskeletal: Normal range of motion and neck supple.  Cardiovascular:     Rate and Rhythm: Normal rate and regular rhythm.  Pulmonary:     Effort: Pulmonary effort is normal.     Breath sounds: Normal breath sounds. No wheezing, rhonchi or rales.  Abdominal:     General: Bowel sounds are normal.     Palpations: Abdomen is soft.     Tenderness: There is no abdominal tenderness. There is no guarding or rebound.  Musculoskeletal: Normal range of motion.  Skin:    General: Skin is warm and dry.     Findings: No rash.  Neurological:     General: No focal deficit present.     Mental Status: She is alert and oriented to person, place, and time.     Sensory: No sensory deficit.     Motor: No weakness.     Coordination: Coordination normal.     Gait: Gait normal.     Deep Tendon Reflexes: Reflexes normal.      ED Treatments  / Results  Labs (all labs ordered are listed, but only abnormal results are displayed) Labs Reviewed  BASIC METABOLIC PANEL - Abnormal; Notable for the following components:      Result Value   Sodium 133 (*)    Chloride 93 (*)    Glucose, Bld 102 (*)    BUN 7 (*)    All other components within normal limits  CBC  URINALYSIS, ROUTINE W REFLEX MICROSCOPIC   Results for orders placed or performed during the hospital encounter of 08/65/78  Basic metabolic panel  Result Value Ref Range   Sodium 133 (L) 135 - 145 mmol/L   Potassium 3.9 3.5 - 5.1 mmol/L   Chloride 93 (L)  98 - 111 mmol/L   CO2 26 22 - 32 mmol/L   Glucose, Bld 102 (H) 70 - 99 mg/dL   BUN 7 (L) 8 - 23 mg/dL   Creatinine, Ser 0.53 0.44 - 1.00 mg/dL   Calcium 9.3 8.9 - 10.3 mg/dL   GFR calc non Af Amer >60 >60 mL/min   GFR calc Af Amer >60 >60 mL/min   Anion gap 14 5 - 15  CBC  Result Value Ref Range   WBC 6.6 4.0 - 10.5 K/uL   RBC 4.09 3.87 - 5.11 MIL/uL   Hemoglobin 13.2 12.0 - 15.0 g/dL   HCT 39.5 36.0 - 46.0 %   MCV 96.6 80.0 - 100.0 fL   MCH 32.3 26.0 - 34.0 pg   MCHC 33.4 30.0 - 36.0 g/dL   RDW 11.9 11.5 - 15.5 %   Platelets 225 150 - 400 K/uL   nRBC 0.0 0.0 - 0.2 %  Urinalysis, Routine w reflex microscopic  Result Value Ref Range   Color, Urine YELLOW YELLOW   APPearance CLEAR CLEAR   Specific Gravity, Urine 1.017 1.005 - 1.030   pH 8.0 5.0 - 8.0   Glucose, UA NEGATIVE NEGATIVE mg/dL   Hgb urine dipstick NEGATIVE NEGATIVE   Bilirubin Urine NEGATIVE NEGATIVE   Ketones, ur 20 (A) NEGATIVE mg/dL   Protein, ur 30 (A) NEGATIVE mg/dL   Nitrite NEGATIVE NEGATIVE   Leukocytes,Ua TRACE (A) NEGATIVE   WBC, UA 6-10 0 - 5 WBC/hpf   Bacteria, UA NONE SEEN NONE SEEN   Squamous Epithelial / LPF 0-5 0 - 5   Mucus PRESENT     EKG EKG Interpretation  Date/Time:  Sunday January 16 2019 22:26:08 EDT Ventricular Rate:  71 PR Interval:  172 QRS Duration: 76 QT Interval:  392 QTC Calculation: 425 R Axis:   51  Text Interpretation:  Normal sinus rhythm Biatrial enlargement Left ventricular hypertrophy ST elevation, consider early repolarization Abnormal ECG motion artifact. Otherwise no significant change Confirmed by Addison Lank [phone]) on 01/16/2019 11:57:56 PM   Radiology No results found.  Procedures Procedures (including critical care time)  Medications Ordered in ED Medications  sodium chloride flush (NS) 0.9 % injection 3 mL (has no administration in time range)     Initial Impression / Assessment and Plan / ED Course  I have reviewed the triage vital signs and the nursing notes.  Pertinent labs & imaging results that were available during my care of the patient were reviewed by me and considered in my medical decision making (see chart for details).        Patient to ED with symptoms of sudden onset nausea and headache around 8:00 pm tonight.   She is awake and oriented on initial exam. Husband at bedside. No neurologic deficits on exam. She has moved from bed to [facility] and back to bed without difficulty or limitation.   Labs, Head CT pending.   Labs are essentially unremarakble. Head CT normal. There is no evidence to suggest acute infection, IC bleeding.   She is seen by Dr. Leonette Monarch. She is feeling better over time. Husband reports she has returned to baseline. GI cocktail provided for additional relief of nausea which is successful. She is felt appropriate for discharge home.   Final Clinical Impressions(s) / ED Diagnoses   Final diagnoses:  None   1. Nausea, vomiting  2. Nonspecific headache  ED Discharge Orders    None       Charlann Lange, [place]  01/18/19 [place], [place], MD 01/18/19 [phone]

## 2019-02-10 ENCOUNTER — Telehealth: Payer: Self-pay | Admitting: Internal Medicine

## 2019-02-10 NOTE — Telephone Encounter (Addendum)
Spoke with daughter, she is requesting oxygen supplies (nasal cannulas) be sent to [facility] in [place]. Her husband is getting treatments for brain cancer and she is uncertain if she will be coming back to this area. Can we send in the order?

## 2019-02-11 NOTE — Telephone Encounter (Signed)
Confirmed with Jonelle Sidle that tubing and cannula were needed for the patient. Called [place] with [employer] to let her know. She forwarded request to their [facility] office [phone].

## 2019-02-11 NOTE — Telephone Encounter (Signed)
Sorry to hear - let her know  Yes you can send it to there - she needs to join [facility] pulmonary or  [employer] or [facility] in the area - where in [place] is she?

## 2019-02-11 NOTE — Telephone Encounter (Addendum)
Spoke with Estill Bamberg with [employer]. She stated the patient has been traveling back and forth to [place] for 6 years. She also confirmed the patient has to be on tanks only when in [place] as their location in [place] never had liquid oxygen for the patient to use and that the patient is already aware of this.  Need to confirm from the patient or her daughter the exact supplies she needs. Estill Bamberg [facility]) requested call back once it can be confirmed. She said not order was needed, but to call her back to let her know.

## 2019-02-16 NOTE — Telephone Encounter (Signed)
Per Dana's documentation, Julia Lucero with [name] has forwarded pt's request for tubing and cannula to the [facility] office [phone]. Nothing further needed at this time.

## 2019-03-16 ENCOUNTER — Telehealth: Payer: Self-pay | Admitting: Internal Medicine

## 2019-03-16 NOTE — Telephone Encounter (Signed)
LMTCB

## 2019-03-16 NOTE — Telephone Encounter (Signed)
Pt returned missed call 

## 2019-03-17 NOTE — Telephone Encounter (Signed)
Spoke with pt' s daughter. She stated that [place] [facility] in [place] needed a new office visit for her to continue to receive oxygen from them.   I called [employer] to see if the virtual visit would be enough for insurance and they stated that the patient needed a qualifying walk because she is no longer on the travel program. We are unable to do this on a virtual visit.   I called the daughter to let her know and she understood and will get back with [employer] in [place]. She states her parents are not going to stay but she doesn't know when they will be back so she would have to stay under the travel program. She states she would call us back if she needs [place] or to schedule virtual visit. Will close encounter.

## 2019-03-30 ENCOUNTER — Telehealth: Payer: Self-pay | Admitting: Internal Medicine

## 2019-03-30 NOTE — Telephone Encounter (Signed)
LMTCB

## 2019-04-01 NOTE — Telephone Encounter (Signed)
ATC Patient. LMTCB. Patient's last OV 11/17/2017 with MR  Per 03/16/19- telephone encounter with Jonelle Sidle, CMA  Spoke with pt' s daughter. She stated that [facility] in [place] needed a new office visit for her to continue to receive oxygen from them.   I called [employer] to see if the virtual visit would be enough for insurance and they stated that the patient needed a qualifying walk because she is no longer on the travel program. We are unable to do this on a virtual visit.   I called the daughter to let her know and she understood and will get back with [employer] in [place]. She states her parents are not going to stay but she doesn't know when they will be back so she would have to stay under the travel program. She states she would call us back if she needs [place] or to schedule virtual visit. Will close encounter.

## 2019-04-04 NOTE — Telephone Encounter (Signed)
I remember telling pt's daughter that she would have to be seen by a pulmonologist in [place] to have an order written for oxygen, see previous notes. We are not able to send the order anymore because the pt told [employer] that she was staying there and an order needed to come from a doctor in her state. Even thought the daughter claims this is just temporary, the DME company thinks otherwise. ATC pt, no answer. Left message for pt to call back.

## 2019-04-05 ENCOUNTER — Ambulatory Visit (INDEPENDENT_AMBULATORY_CARE_PROVIDER_SITE_OTHER): Payer: Medicare Other | Admitting: Internal Medicine

## 2019-04-05 ENCOUNTER — Other Ambulatory Visit: Payer: Self-pay

## 2019-04-05 DIAGNOSIS — R0902 Hypoxemia: Secondary | ICD-10-CM | POA: Diagnosis not present

## 2019-04-05 DIAGNOSIS — R0689 Other abnormalities of breathing: Secondary | ICD-10-CM | POA: Diagnosis not present

## 2019-04-05 DIAGNOSIS — E550 Rickets, active: Secondary | ICD-10-CM | POA: Diagnosis not present

## 2019-04-05 DIAGNOSIS — R0600 Dyspnea, unspecified: Secondary | ICD-10-CM

## 2019-04-05 DIAGNOSIS — I272 Pulmonary hypertension, unspecified: Secondary | ICD-10-CM | POA: Diagnosis not present

## 2019-04-05 DIAGNOSIS — Q676 Pectus excavatum: Secondary | ICD-10-CM

## 2019-04-05 NOTE — Progress Notes (Signed)
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 Decompensated Cor Pulmonale and discovery of pulmonary hypertension on ECHO (no right heart cath) following trip to [facility] in March 2011 due to pneumonia (CT chest  march 2011 at [place] consolidation and bialteral effusion). Negative Autoimmune Profile May 2011 except for mild raised CRP and borderline positive rheumatoid factor   March 04, 2010: feels well. Doing rehab. Not desaturating there. Due to fly to [facility] 10/20/2011on [employer]. Lot of questions on  flight o2. WE walked her today 185 feet x 3 laps and lowest o2 was only 92%. This is a huge improvement because in MAy 2011 she did desaturate with exertion. No new complaints. She continues on lasix and amlodipine started by Dr. Gwenlyn Found  in Apri 2011. Compliant with medicines. No interim problems. Denies edema, cough, orthopnea, paroxysmal nocturnal dyspnea, wheeze. She has seen Dr. Gwenlyn Found in interim and per his records he has resasured her and is following expectantly. REC: EXPECTANT FOLLOWUP  OV 11/01/2010: OVerall well. Has completed rehab. Underwent PT for muscle strengthening but now doing it on own. Unclear if has increasing dyspnea or not. AT times she denies it but at times she states she is marginally worse. Certainly seems to get tired by end of the day which might be baseline. Uses o2 wwiht exertion.  Continues lasix and amlodipine. Not seen Dr. Gwenlyn Found in interim either. No worsening edema. No wheeze. No chest pain. No syncope. No weight loss. No pnd. No orthopnea. Had 3 flight trips in interim and did well. Has repeat question on mechanism of restrictive chest disease and pulmonary hypertension. Walking desaturatin test today - and dropped to 88% - 87% after 2 laps  REC  Please continue daily exercise - make sure heart rate is < 120-125 - you can take your heart rate that high but not higher  Make sure  pulse ox is always >88% with or without oxygen  Use oxygen with exercise and walking and moving around  We will set up overnight oxygen study on room air to determine your oxygen need at night  Please see Dr. Gwenlyn Found as well  REturn to see me in 6 months  Come sooner if there are problems  OV 08/04/2011  OVerall well. Doing exercises 5-6 times per week in 2 sessions daily. Really helping. Overall stable. Class 2 dyspnea with exertion that is stable and relieved by rest. Uses o2 wwiht exertion.  Continues lasix and amlodipine. Not seen Dr. Gwenlyn Found in interim either. No worsening edema. No wheeze. No chest pain. No syncope. No weight loss. No pnd. No orthopnea. Had several flight trips in interim to [place] and did well; does DVT precaution exercises. Walking desaturatin test today - and dropped to 88% - 87% after 1 laps x 185 feet. The exertional desaturation is marginally worse than last OV. We will keep an eye on this because she is otherwise well  Past, Family, Social reviewed: no change since last visit   Please continue daily exercise - make sure heart rate is < 120-125 - you can take your heart rate that high but not higher  Make sure pulse ox is always >88% with or without oxygen  Use oxygen with exercise and walking and moving around  REturn to see me in 8 Months. Wil monitor her exertional desaturations  Come sooner if there are problems   OV 03/30/2012 Resp wise stable. Says she feels hollow inside. Unable to explain.  Spiritually weary. No other iesues. Walking desaturation test on 03/30/2012 185 feet x 1 laps:  did desaturate. Rest pulse ox was 94%, final pulse ox was 88%. HR response 93/min at rest to 114/min at peak exertion at 1 laps. The distance to desaturation is similar to Feb 2013 but worse since May 2012  [facility] Ensure heart ok with Dr Gwenlyn Found   10/20/2012 Office Visit  followup  related restrictive cage defect related to rickets   - Last seen in December 2013. After that for  my recommendation she did visit cardiology Dr. Gwenlyn Found. She and husband recollect having an echocardiogram that is reported as normal per her history. Overall she's stable although she feels that over the span of 2 years she's is a little bit more fatigued than baseline especially early in the morning. She's not sure if she's had her vitamin D and thyroid function tests checked. Also of note, she went to [place] by car and return a few weeks ago. She did take several breaks every few hours and stretch the legs. Upon return she had mild pedal edema that lasted 2 days and then resolved spontaneously. There was no warmth of the leg or hemoptysis or chest pain or undue dyspnea. It is noted that she does have bilateral varicose veins. No other health issues  - Walk test today 10/20/2012: 185 feet x3 laps on room air: Pulse oximetry 86% at the third lap with a heart rate of 107   Past, Family, Social reviewed: no change since last visit    #Respiratory  - clinically you are stable with good oxygen levels for 550 feet of exertion  - suspect some deconditioning over winter; so will re-refer to pulmonary rehabilitation - continue current care  #Fatigue - talk to ARONSON,RICHARD A, MD about checking vitamin D and thyroid levels  #Followup - 9 months or sooner if needed  OV 09/29/2013  Chief Complaint  Patient presents with  . Follow-up    pt c/o SOB with exertion.     followup  related restrictive cage defect related to rickets with associated pulmonary hypertension  - Last seen in May 2014. Since then overall stable. Last visit she only desaturated on room air after walking 185 feet x3 laps. However, she continues to use 2 L nasal cannula are 24 hours. She's feels that she needs this. But otherwise he does be that she's not any worse or any better. She does say that her travel plans are unchanged but as she ages she finds that it's more complicated to organize do to dependency her oxygen. Is no  worsening edema or chest pain or cough or sputum production.  - Walk test on room air her and made her feet x3 laps: At rest her heart rate was 76 per minute. Pulse ox is 96 a minute. After exertion 185 feet x1 lap she desaturated down to 87% and she stayed down at 87%. Peak heart rate was 101. Therefore exercise was terminated. This is similar to ? Worse than a year ago  - She is somewhat anxious about her long term future. We discussed [place] referral to pulm htn clinic and she is open to doing that  Social history: Husband suffered is herniation recently and is on conservative treatment     OV 09/08/2014  Chief Complaint  Patient presents with  . Follow-up    Pt stated she was feeling well until catching cold. Pt c/o sinus congestion and cough. Pt currently taking levaquin. Pt c/o dry  cough. Pt denies increase in SOB and CP/tightness.     Annual follow-up childhood rickets associated with thoracic cage defect associated with clinical pulmonary hypertension /cor pulmolane based on echo. Clinically she suffers from exertional hypoxemia.   I have been following Mrs. Vantine for many years. The last 1 year she reports has been stable. In the interim in summer 2015 she did go to [employer] pulmonary hypertension program. According to her history she was reassured that problems are related to thoracic cage defect and she was discharged from follow-up She does not recollect having had a right heart catheterization though chart review at [facility] suggest that she had it but I'm unable to access these results. She did have a VQ scan which was determined is a low probability for pulmonary embolism. She did have a chest x-ray that showed enlarged cardiac silhouette along with pectus excavatum. And also thoracic scoliosis. Currently overall she is doing well but a week ago she did pick up a cold and she is on a ten-day Levaquin. Today Walking desaturation test 185 feet 3 laps on room air: She walked all 3  laps and at the very end dropped to 88%. This is probably better than a year ago   of note, exam shows bibasal crackles which I believe might be new for her    OV 09/10/2015  Chief Complaint  Patient presents with  . Follow-up    Pt here for 1 year f/u. Pt states her breathing is doing well and she states she feels stronger. Pt denies cough, wheezing, CP/tightness, and swelling.     Annual follow-up childhood rickets associated with thoracic cage defect associated with clinical pulmonary hypertension /cor pulmolane based on echo. Clinically she suffers from exertional hypoxemia.    This is an annual follow-up. Last seen April 2016. In the interim she developed tongue swelling or lip swelling with hot sauce. She saw Dr. Annamaria Boots for this. She's been reassured. Overall she's feeling well. She has been to [place] bunch of times to see the grandchildren. Most recent trip was a few weeks ago. She feels stronger. She has less exertional dyspnea than a year ago. She does aerobic work associated with light weight training of her upper extremities. There are no new issues. Walking desaturation test 185 feet 3 laps on room air: 1 lap and desat to84% (baseine 2 years ago is 87%)  So stable  I did do high resolution CT chest April 2016 for crackles on her chest exam but was clear without any interstitial lung disease.    OV 10/14/2016  Chief Complaint  Patient presents with  . Follow-up    Pt here for 12 month f/u. Pt states her SOB is doing well. Pt denies cough, CP/tightness, and f/c/s.     Annual follow-up in childhood rickets associated with thoracic cage defect associated with clinical pulmonary hypertension/cor pulmonale based on echo. Clinically she suffers from exertional hypoxemia  This is a one year follow-up. This year she did see her cardiologist Dr. Alvester Chou and his been reassured. She continues to travel to [place]. She has no problems. She uses oxygen only with exertion and at  daytime at rest. She does not use nocturnal oxygen. She says that as she ages she is noticing thoracic restriction on her breathing but otherwise feels good. She continues with aerobic exercises and walking desaturation test 185 feet 3 laps on room air: 94% at rest, ended at 91% after 3rd lap. Improved   OV 11/17/2017  Chief  Complaint  Patient presents with  . Follow-up    1-year follow up visit.  Pt states she has been doing well since last visit. Pt has had some tingling in a couple of her fingers on left hand. DME: [facility], 2L.    Annual follow-up in childhood rickets associated with thoracic cage defect (pectus) associated with clinical pulmonary hypertension/cor pulmonale based on echo. Clinically she suffers from exertional hypoxemia    83 year old female with the above problem.  Presents for one-year follow-up.  In terms of her dyspnea and health status overall she is stable.  She says she uses oxygen as needed at night and at daytime she uses it continuously.  She does not have any worsening dyspnea.  She does not monitor her pulse ox.  She uses oxygen for a travel as well.  Review of the chart indicates that probably in October 2017 she got a pneumonia vaccine on her left deltoid area and since then she is reporting on and off tingling.  In fact she called [place] about it a year ago.  This happened in another office.  There is no neurologic deficit as such.  This seems to bother her more than other symptoms.  Walking desaturation test today did not show any desaturations on room air.  Review of the chart indicates last imaging chest x-ray CT scan was in 2016 which showed her pectus excavatum   Simple office walk 185 feet x  3 laps goal with forehead probe 11/17/2017   O2 used Room air  Number laps completed All 3  Comments about pace Slow pace   Resting Pulse Ox/HR 97% and 69/min  Final Pulse Ox/HR 91% and 94/min  Desaturated </= 88% no  Desaturated <= 3% points yes  Got Tachycardic >/=  90/min yes  Symptoms at end of test No compalints  Miscellaneous comments none     OV 04/05/2019  Subjective:  Patient ID: Julia Lucero, female , DOB: 1935/12/26 , age 83 y.o. , MRN: [id number] , ADDRESS: [address] [place] [address]   04/05/2019 -   Chief Complaint  Patient presents with  . Follow-up    Pt states overall she feels well. Pt c/o DOE - at baseline. Pt denies cough, CP/tightness, f/c/s.    Annual follow-up in childhood rickets associated with thoracic cage defect (pectus) associated with clinical pulmonary hypertension/cor pulmonale based on echo. Clinically she suffers from exertional hypoxemia  HPI [facility] 83 y.o. -this is a telephone visit.  The risks, benefits and limitations of telephone visit were explained.  Patient agreed and decided to proceed.  The telephone visit.  She is currently in [place].  She went to visit her daughter in the [place] area and then her husband ended up having a cancer diagnosis and is on hospice.  She plans to return to [place] although the return date is not certain.  She tells me that overall she is stable.  She wants to use a portable oxygen but the [facility] in [place] wants documented exertional desaturation test before they will come and supply her the oxygen.  She tries to do this is a virtual visit but this might not be possible.  That is what she is calling.  She tells me that she had a high-dose flu shot and had a shoulder reaction.  She is willing to try regular flu shot this season.     ROS - per HPI     has a past medical history of Allergy, Chronic  fatigue, COPD (chronic obstructive pulmonary disease) (HCC), Disorder of bone and cartilage, unspecified, Hypertension, Oxygen deficiency, Oxygen dependent, Pulmonary hypertension ([place]), Restrictive lung disease, and Rickettsia infection.   reports that she has never smoked. She has never used smokeless tobacco.  Past Surgical History:  Procedure  Laterality Date  . OOPHORECTOMY      Allergies  Allergen Reactions  . Methocarbamol     Altered thought process  . Penicillins   . Sulfonamide Derivatives   . Amoxicillin Rash    Immunization History  Administered Date(s) Administered  . Influenza Split 03/12/2011, 03/30/2012  . Influenza Whole 03/04/2010  . Influenza, High Dose Seasonal PF 04/17/2016  . Influenza, Seasonal, Injecte, Preservative Fre 04/26/2014  . Influenza,inj,Quad PF,6+ Mos 03/09/2013, 05/28/2015  . Pneumococcal Conjugate-13 03/14/2016  . Pneumococcal Polysaccharide-23 06/10/2007, 03/09/2012    Family History  Problem Relation Age of Onset  . Diabetes Father   . Heart disease Father   . Hyperlipidemia Brother   . Arthritis Sister   . Colon cancer Mother   . Heart disease Mother   . Hypertension Mother   . Heart disease Maternal Grandfather   . Heart disease Paternal Grandmother   . Heart disease Paternal Grandfather   . Hyperlipidemia Brother   . Hypertension Brother      Current Outpatient Medications:  .  amLODipine (NORVASC) 5 MG tablet, TAKE 1 TABLET(5 MG) BY MOUTH DAILY (Patient taking differently: Take 5 mg by mouth daily. ), Disp: 30 tablet, Rfl: 0 .  furosemide (LASIX) 40 MG tablet, TAKE 1/2 TABLET BY MOUTH DAILY (Patient taking differently: Take 20 mg by mouth daily. ), Disp: 45 tablet, Rfl: 0 .  Multiple Vitamin (MULTIVITAMIN WITH MINERALS) TABS tablet, Take 1 tablet by mouth daily., Disp: , Rfl:  .  ondansetron (ZOFRAN ODT) 4 MG disintegrating tablet, Take 1 tablet (4 mg total) by mouth every 8 (eight) hours as needed for nausea or vomiting., Disp: 12 tablet, Rfl: 0 .  OXYGEN-HELIUM IN, Inhale 2 L into the lungs continuous., Disp: , Rfl:  .  valACYclovir (VALTREX) 1000 MG tablet, Take 2 pills (2000 mg) at the first sign of cold sore, repeat 2 pills 12 hours later (Patient not taking: Reported on 11/17/2017), Disp: 20 tablet, Rfl: 0      Objective:   There were no vitals filed for this  visit.  Estimated body mass index is 25.53 kg/m as calculated from the following:   Height as of 11/17/17: 4\' 11"  (1.499 m).   Weight as of 11/17/17: 126 lb 6.4 oz (57.3 kg).  @WEIGHTCHANGE @  There were no vitals filed for this visit.   Physical Exam         Assessment:       ICD-10-CM   1. Exercise hypoxemia  R09.02   2. Dyspnea and respiratory abnormality  R06.00    R06.89   3. Pulmonary hypertension (HCC)  I27.20   4. Pectus excavatum  Q67.6   5. Rickets  E55.0        Plan:     Patient Instructions     ICD-10-CM   1. Exercise hypoxemia  R09.02   2. Dyspnea and respiratory abnormality  R06.00    R06.89   3. Pulmonary hypertension (HCC)  I27.20   4. Pectus excavatum  Q67.6   5. Rickets  E55.0       Clinically stable on this virtual visit 04/05/2019 compared to June 2019 Husband in hospice Need o2 qualifying walk Possible reaction to high dose flu  shot in 2019 Currently living in [place], [place] - encouraged to get at least regular dose flu shot  - CMA/RN to call [place], [place] and see if they can do qualifying walk at patient home in [place] under our license  - if not, patient has to make a regular visiti with a doctor in [place]  followup 9 months or sooner if needed   (> 50% of this 15 min visit spent in face to face counseling or/and coordination of care by this undersigned MD - Dr Brand Males. This includes one or more of the following documented above: discussion of test results, diagnostic or treatment recommendations, prognosis, risks and benefits of management options, instructions, education, compliance or risk-factor reduction)   SIGNATURE    Dr. Brand Males, M.D., F.C.C.P,  Pulmonary and Critical Care Medicine Staff Physician, [place] Director - Interstitial Lung Disease  Program  Pulmonary [place] [facility] [place], [place], [address]  Pager: [phone], If  no answer or between  15:00h - 7:00h: call [phone] Telephone: [phone]  11:38 AM 04/05/2019

## 2019-04-05 NOTE — Patient Instructions (Addendum)
ICD-10-CM   1. Exercise hypoxemia  R09.02   2. Dyspnea and respiratory abnormality  R06.00    R06.89   3. Pulmonary hypertension (HCC)  I27.20   4. Pectus excavatum  Q67.6   5. Rickets  E55.0       Clinically stable on this virtual visit 04/05/2019 compared to June 2019 Husband in hospice Need o2 qualifying walk Possible reaction to high dose flu shot in 2019 Currently living in [place] - encouraged to get at least regular dose flu shot  - CMA/RN to call [place], [place] and see if they can do qualifying walk at patient home in [place] under our license  - if not, patient has to make a regular visiti with a doctor in [place]  followup 9 months or sooner if needed

## 2019-04-07 ENCOUNTER — Telehealth: Payer: Self-pay | Admitting: Internal Medicine

## 2019-04-07 NOTE — Telephone Encounter (Signed)
Called and spoke to Julia Lucero after leaving a VM on 10/27 regarding pt current living in [place] but needing to be re-qualified for O2.  Spoke with rep, Julia Lucero, she states the request is coming from the [facility] in [place]. Julia Lucero states the pt will need to find a provider in [place] and qualify there and then send the results to the [facility] in [place], [place] (phone: [phone]).  Called and spoke to pt's daughter. Informed her of the recs per Julia Lucero with [facility]. The daughter, verbalized understanding and denied any further questions or concerns at this time.

## 2019-04-11 NOTE — Telephone Encounter (Signed)
We have attempted to contact pt several times with no success or call back from pt. Per triage protocol, message will be closed.  

## 2019-04-19 ENCOUNTER — Telehealth: Payer: Self-pay | Admitting: Cardiovascular Disease

## 2019-04-19 ENCOUNTER — Other Ambulatory Visit: Payer: Self-pay | Admitting: Cardiovascular Disease

## 2019-04-19 NOTE — Telephone Encounter (Signed)
Rx request sent to pharmacy.  

## 2019-04-19 NOTE — Telephone Encounter (Signed)
Spoke to pt daughter, who stated they have scheduled a f/u appointment with Dr. Gwenlyn Found in December. They are currently living in [place] and pt husband recently passed away. Pt daughter stated she just needs refill for furosemide to get her to appt. Rx sent to requested pharmacy via [place].

## 2019-04-19 NOTE — Telephone Encounter (Signed)
New Message   *STAT* If patient is at the pharmacy, call can be transferred to refill team.   1. Which medications need to be refilled? (please list name of each medication and dose if known) furosemide (LASIX) 40 MG tablet  2. Which pharmacy/location (including street and city if local pharmacy) is medication to be sent to? [place], [place] - [address] AT [address]  3. Do they need a 30 day or 90 day supply? 90 day

## 2019-05-17 ENCOUNTER — Ambulatory Visit: Payer: Medicare Other | Admitting: Cardiovascular Disease

## 2019-06-13 ENCOUNTER — Other Ambulatory Visit: Payer: Self-pay

## 2019-06-13 MED ORDER — AMLODIPINE BESYLATE 5 MG PO TABS: 5.0000 mg | ORAL_TABLET | Freq: Every day | ORAL | 1 refills | Status: DC

## 2019-06-28 ENCOUNTER — Other Ambulatory Visit: Payer: Self-pay

## 2019-06-28 ENCOUNTER — Encounter: Payer: Self-pay | Admitting: Cardiovascular Disease

## 2019-06-28 ENCOUNTER — Ambulatory Visit (INDEPENDENT_AMBULATORY_CARE_PROVIDER_SITE_OTHER): Payer: Medicare Other | Admitting: Cardiovascular Disease

## 2019-06-28 DIAGNOSIS — I272 Pulmonary hypertension, unspecified: Secondary | ICD-10-CM

## 2019-06-28 NOTE — Assessment & Plan Note (Signed)
Her last 2D echo in our system performed 05/11/2012 revealed normal LV systolic function without evidence of pulmonary hypertension.

## 2019-06-28 NOTE — Progress Notes (Signed)
06/28/2019 Julia Lucero   [birth year]  [id number]  Primary Physician Copland, Gay Filler, MD Primary Cardiologist: Lorretta Harp MD FACP, [place], [place], [place]  HPI:  Julia Lucero is a 84 y.o. mildly overweight, married Caucasian female, mother of 2 who I last saw 1 in the office 10/07/2016.  She is accompanied by her daughter Judson Roch today.  Unfortunately, her husband 41 years died in 05/13/23 brain cancer and she is grieving.. I initially saw her April 2011 with new onset congestive heart failure with CT scan that showed cardiomegaly, interstitial edema and pleural effusions. This was preceded by an upper airway infection followed by a transcontinental flight. A 2D echo performed August 25, 2009, showed normal LV systolic function with a flattened septum consistent with RV pressure overload and mildly reduced systolic function with severe pulmonary hypertension. Her Myoview was negative. She was placed on low dose amlodipine and furosemide and her symptoms resolved. Dr. Chase Caller thought that her symptoms were related to restrictive lung disease secondary to her remote rickets. She is on home O2 and has otherwise been stable since I last saw her. Followup echo performed May 11, 2012, was entirely normal without evidence of pulmonary hypertension. Since I saw her one year ago she's been relatively asymptomatic on home O2. She has been following with Dr. Chase Caller as well as obtaining a second opinion at [facility] thought that she did not have primary hypertension but rather restrictive lung disease secondary to her rickets.   Since I saw her in the office almost 3 years ago she continues to be on continuous oxygen complains of shortness of breath.  She denies chest pain.  Current Meds  Medication Sig  . amLODipine (NORVASC) 5 MG tablet Take 1 tablet (5 mg total) by mouth daily.  . furosemide (LASIX) 40 MG tablet TAKE 1/2 TABLET(20 MG) BY MOUTH DAILY  . Multiple Vitamin  (MULTIVITAMIN WITH MINERALS) TABS tablet Take 1 tablet by mouth daily.  . OXYGEN-HELIUM IN Inhale 2 L into the lungs continuous.     Allergies  Allergen Reactions  . Methocarbamol     Altered thought process  . Penicillins   . Sulfonamide Derivatives   . Amoxicillin Rash    Social History   Socioeconomic History  . Marital status: Married    Spouse name: Not on file  . Number of children: 2  . Years of education: 83  . Highest education level: Not on file  Occupational History  . Not on file  Tobacco Use  . Smoking status: Never Smoker  . Smokeless tobacco: Never Used  Substance and Sexual Activity  . Alcohol use: Yes    Alcohol/week: 6.0 standard drinks    Types: 6 Standard drinks or equivalent per week    Comment: Rarely  . Drug use: No  . Sexual activity: Never  Other Topics Concern  . Not on file  Social History Narrative   Fun: Read, exercise, keep up with family   Denies abuse and feels safe at home.    Social Determinants of Health   Financial Resource Strain:   . Difficulty of Paying Living Expenses: Not on file  Food Insecurity:   . Worried About Charity fundraiser in the Last Year: Not on file  . Ran Out of Food in the Last Year: Not on file  Transportation Needs:   . Lack of Transportation (Medical): Not on file  . Lack of Transportation (Non-Medical): Not on file  Physical Activity:   .  Days of Exercise per Week: Not on file  . Minutes of Exercise per Session: Not on file  Stress:   . Feeling of Stress : Not on file  Social Connections:   . Frequency of Communication with Friends and Family: Not on file  . Frequency of Social Gatherings with Friends and Family: Not on file  . Attends Religious Services: Not on file  . Active Member of Clubs or Organizations: Not on file  . Attends Archivist Meetings: Not on file  . Marital Status: Not on file  Intimate Partner Violence:   . Fear of Current or Ex-Partner: Not on file  . Emotionally  Abused: Not on file  . Physically Abused: Not on file  . Sexually Abused: Not on file     Review of Systems: General: negative for chills, fever, night sweats or weight changes.  Cardiovascular: negative for chest pain, dyspnea on exertion, edema, orthopnea, palpitations, paroxysmal nocturnal dyspnea or shortness of breath Dermatological: negative for rash Respiratory: negative for cough or wheezing Urologic: negative for hematuria Abdominal: negative for nausea, vomiting, diarrhea, bright red blood per rectum, melena, or hematemesis Neurologic: negative for visual changes, syncope, or dizziness All other systems reviewed and are otherwise negative except as noted above.    Blood pressure (!) 148/60, pulse 62, temperature 98.1 F (36.7 C), height 4\' 11"  (1.499 m), weight 106 lb (48.1 kg).  General appearance: alert and no distress Neck: no adenopathy, no carotid bruit, no JVD, supple, symmetrical, trachea midline and thyroid not enlarged, symmetric, no tenderness/mass/nodules Lungs: clear to auscultation bilaterally Heart: regular rate and rhythm, S1, S2 normal, no murmur, click, rub or gallop Extremities: extremities normal, atraumatic, no cyanosis or edema Pulses: 2+ and symmetric Skin: Skin color, texture, turgor normal. No rashes or lesions Neurologic: Alert and oriented X 3, normal strength and tone. Normal symmetric reflexes. Normal coordination and gait  EKG not performed today  ASSESSMENT AND PLAN:   Pulmonary hypertension ([place]) Her last 2D echo in our system performed 05/11/2012 revealed normal LV systolic function without evidence of pulmonary hypertension.      Lorretta Harp MD FACP,FACC,FAHA, [facility] 06/28/2019 4:15 PM

## 2019-06-28 NOTE — Patient Instructions (Signed)
Medication Instructions:  Your physician recommends that you continue on your current medications as directed. Please refer to the Current Medication list given to you today.  If you need a refill on your cardiac medications before your next appointment, please call your pharmacy.   Lab work: NONE  Testing/Procedures: NONE  Follow-Up: At [facility], you and your health needs are our priority.  As part of our continuing mission to provide you with exceptional heart care, we have created designated Provider Care Teams.  These Care Teams include your primary Cardiologist (physician) and Advanced Practice Providers (APPs -  Physician Assistants and Nurse Practitioners) who all work together to provide you with the care you need, when you need it. You may see Jonathan Berry, MD or one of the following Advanced Practice Providers on your designated Care Team:    Luke Kilroy, PA-C  Callie Goodrich, PA-C  Jesse Cleaver, FNP Your physician wants you to follow-up as needed.      

## 2019-07-11 ENCOUNTER — Other Ambulatory Visit: Payer: Self-pay | Admitting: Cardiovascular Disease

## 2019-07-22 DIAGNOSIS — Z23 Encounter for immunization: Secondary | ICD-10-CM | POA: Diagnosis not present

## 2019-08-12 ENCOUNTER — Ambulatory Visit (INDEPENDENT_AMBULATORY_CARE_PROVIDER_SITE_OTHER): Payer: Medicare Other | Admitting: Internal Medicine

## 2019-08-12 ENCOUNTER — Encounter: Payer: Self-pay | Admitting: Internal Medicine

## 2019-08-12 ENCOUNTER — Other Ambulatory Visit: Payer: Self-pay

## 2019-08-12 VITALS — BP 144/68 | HR 84 | Temp 96.8°F | Resp 16 | Ht 59.0 in | Wt 102.0 lb

## 2019-08-12 DIAGNOSIS — I2781 Cor pulmonale (chronic): Secondary | ICD-10-CM | POA: Diagnosis not present

## 2019-08-12 DIAGNOSIS — M8589 Other specified disorders of bone density and structure, multiple sites: Secondary | ICD-10-CM

## 2019-08-12 DIAGNOSIS — R4189 Other symptoms and signs involving cognitive functions and awareness: Secondary | ICD-10-CM | POA: Diagnosis not present

## 2019-08-12 DIAGNOSIS — F4321 Adjustment disorder with depressed mood: Secondary | ICD-10-CM | POA: Diagnosis not present

## 2019-08-12 DIAGNOSIS — R4689 Other symptoms and signs involving appearance and behavior: Secondary | ICD-10-CM | POA: Insufficient documentation

## 2019-08-12 DIAGNOSIS — Z8639 Personal history of other endocrine, nutritional and metabolic disease: Secondary | ICD-10-CM | POA: Diagnosis not present

## 2019-08-12 DIAGNOSIS — F339 Major depressive disorder, recurrent, unspecified: Secondary | ICD-10-CM | POA: Diagnosis not present

## 2019-08-12 DIAGNOSIS — F22 Delusional disorders: Secondary | ICD-10-CM

## 2019-08-12 HISTORY — DX: Major depressive disorder, recurrent, unspecified: F33.9

## 2019-08-12 MED ORDER — MIRTAZAPINE 7.5 MG PO TABS: 7.5000 mg | ORAL_TABLET | Freq: Every day | ORAL | 5 refills | Status: DC

## 2019-08-12 NOTE — Progress Notes (Signed)
Provider:  Rexene Edison. Mariea Clonts, D.O., C.M.D. Location:   [place]   Place of Service:   clinic  Previous PCP: Copland, Gay Filler, MD Patient Care Team: Copland, Gay Filler, MD as PCP - General (Family Medicine) Lorretta Harp, MD as PCP - Cardiology (Cardiology) Brand Males, MD as Consulting Physician (Pulmonary Disease) Danella Sensing, MD as Consulting Physician (Dermatology)  Extended Emergency Contact Information Primary Emergency Contact: [place] Mobile Phone: [phone] Relation: Daughter Interpreter needed? No  Goals of Care: Advanced Directive information Advanced Directives 08/12/2019  Does Patient Have a Medical Advance Directive? Yes  Type of Advance Directive [place]  Does patient want to make changes to medical advance directive? No - Patient declined  Copy of [place] in Chart? Yes - validated most recent copy scanned in chart (See row information)  Would patient like information on creating a medical advance directive? -  need copies of HCPOA/LW  Chief Complaint  Patient presents with  . Establish Care    New Patient     HPI: Patient is a 84 y.o. female seen today to establish with [facility].  Records have been requested from Dr. Retta Diones are actually in epic.  Appears she was last seen for a visit in March of 2019 for memory disturbance.  She's been good.  She is in need of someone to look her over overall per her daughter who is here with her today--Sarah.    Dr. Gwenlyn Found had said she needed an overall assessment when he saw her for her pulmonary hypertension.  Interestingly the last echo in the [facility] did not show pulmonary htn in 2013.  No ILD was seen on CT chest in April of 2016.  Lung disease:  She follows with Dr. Shannan Harper was thought that her hypoxic respiratory failure was due to her remote rickets causing restrictive lung disease, but another physician at [facility] later said it  was primary pulmonary hyperension.  She says she needs assistance regulating her breathing after 85 steps (?).  Her only treatment at this time is her oxygen therapy.  When asked when her breathing difficulties began, she says, "I had traveled abroad and after the flight, she did not recover her ground use of oxygen.  I was hopsitalized for a few days and then went ahead with my trip."  Her daughter says that's not accurate and in 2011, she had pneumonia and passed out on the flight on her way back to [place].  That's when she got her initial diagnosis.    She does not remember that her husband died last year.  Pt was with him at the time.  She now thinks he is still alive.  Sometimes she knows and sometimes she does not.  She says it may be a way of learning to accept it slowly.  They were married for 60 years (she remembered 42 years).  She is now living alone in her house except that her daughter came back with her Jan 9th.  The three of them had lived together in [place] before that while he was ill, it sounds like.  As we spoke more, pt started to describe concerns that her husband had been cheating on her with another woman and that when he went out on bicycle rides for stress relief, he was actually visiting a woman who was a nurse during his hospitalization.  It sounds like this was not true and these thoughts developed in 2019.  Her daughter has family and a job in [place].    Looking through the medical records in epic, in 3/19, she backed out of the spot during driving spot and then forgot what to do next and had a long pause during her driving test.  Her husband had mentioned that she forgot where they'd eaten the day before, also.  She had some problem with the left arm and was treated with gabapentin but did not tolerate it.  No problems with the arm now.  She'd had a CT brain in 2/18 with atrophy and chronic mcrovascular ischemic changes.  MRI at that time showed no acute infarct and stable  atrophy.  She's had vitamin D deficiency in the past.  Had rickets as a child.  She also had pneumonia as a child and reports not being healthy when she was really little.    She had a master's degree in romance languages.   She did a fair amount of traveling in [place] and [place].   Vision:  Fine, but wears corrective lenses all of the time.  Has cataracts, myopia and choroidal dystrophy per notes from 2019.  Had seen Dr. Barbie Haggis.  She's seen optometry at [facility] she says in the past year.  Her daughter did not think that was really true.  She has commented to her daughter that she needs her glasses adjusted.  She's never had the cataracts removed.  She had taken an ambulance ride for nausea, frontal headache, dizziness and difficulty focusing in August of 2020--she improved with a GI cocktail. Electrolytes, blood counts, kidneys and EKG were unremarkable.  She was also given fluids, but no imaging was done then.  She thinks her memory is pretty good, but she also knows she does not recall things chronologicallly as well.  Her daughter has been cooking, but she makes herself a sandwich occasionally.  Food will be left in the [facility] and forgotten about per Judson Roch.  She uses the stove for grilled cheese or soup.  She does not leave the area when cooking.  Knows to call 911 in an emergency.  Knows to get out if there was a big fire.  Does not like to depend on other people.    She has been forgetting her meds some.  She has a pill Environmental education officer.  She did not have meds for a while at all b/c meds were missing completely when her daughter arrived to stay with her early this year.    Appetite has always been good.  She reports eating a full size meal.  Her daughter is shaking her head that she is not really eating well and forgetting to eat.  Weight has dropped from 135 to 102 over the past year (down 33 lbs since 3/19).  Says her mood is pretty good.  Says she struggles at certain times of years (seasonal  affective disorder).  Sleeps fairly well.  Does wake up more often which she did not do in the past.  More interruptions.  Will go urinate if she is awake, but it's not what wakes her.    She's not aware of any hallucinations--her daughter notes that they have disagreed on this.  Patient saw somebody--a nurse from [place]--in the room during plans for the funeral for her husband.  Her daughter reports that the nurse was not there in [place]. Pt also reports that her brother-in-law visited her after her husband's death, but her daughter disagrees.  MMSE - Mini Mental State Exam 08/12/2019  07/09/2016  Orientation to time 3 5  Orientation to Place 4 5  Registration 3 3  Attention/ Calculation 3 5  Recall 3 3  Language- name 2 objects 2 2  Language- repeat 1 1  Language- follow 3 step command 3 3  Language- read & follow direction 1 1  Write a sentence 1 1  Copy design 1 1  Total score 25 30  passed clock.  Past Medical History:  Diagnosis Date  . Allergy   . Chronic fatigue   . COPD (chronic obstructive pulmonary disease) ([place])   . Disorder of bone and cartilage, unspecified   . Hypertension   . Oxygen deficiency   . Oxygen dependent   . Pulmonary hypertension ([place])   . Restrictive lung disease   . Rickettsia infection    Past Surgical History:  Procedure Laterality Date  . OOPHORECTOMY      Social History   Socioeconomic History  . Marital status: Married    Spouse name: Not on file  . Number of children: 2  . Years of education: 28  . Highest education level: Not on file  Occupational History  . Not on file  Tobacco Use  . Smoking status: Never Smoker  . Smokeless tobacco: Never Used  Substance and Sexual Activity  . Alcohol use: Yes    Comment: 4-6 drinks a week  . Drug use: No  . Sexual activity: Never  Other Topics Concern  . Not on file  Social History Narrative   Fun: Read, exercise, keep up with family   Denies abuse and feels safe at home.    Social Determinants  of Health   Financial Resource Strain:   . Difficulty of Paying Living Expenses: Not on file  Food Insecurity:   . Worried About Charity fundraiser in the Last Year: Not on file  . Ran Out of Food in the Last Year: Not on file  Transportation Needs:   . Lack of Transportation (Medical): Not on file  . Lack of Transportation (Non-Medical): Not on file  Physical Activity:   . Days of Exercise per Week: Not on file  . Minutes of Exercise per Session: Not on file  Stress:   . Feeling of Stress : Not on file  Social Connections:   . Frequency of Communication with Friends and Family: Not on file  . Frequency of Social Gatherings with Friends and Family: Not on file  . Attends Religious Services: Not on file  . Active Member of Clubs or Organizations: Not on file  . Attends Archivist Meetings: Not on file  . Marital Status: Not on file    reports that she has never smoked. She has never used smokeless tobacco. She reports current alcohol use. She reports that she does not use drugs.  Functional Status Survey:    Family History  Problem Relation Age of Onset  . Diabetes Father   . Heart disease Father   . Hyperlipidemia Brother   . Arthritis Sister   . Colon cancer Mother   . Heart disease Mother   . Hypertension Mother   . Heart disease Maternal Grandfather   . Heart disease Paternal Grandmother   . Heart disease Paternal Grandfather   . Hyperlipidemia Brother   . Hypertension Brother     Health Maintenance  Topic Date Due  . Samul Dada  08/03/1954  . INFLUENZA VACCINE  01/08/2019  . DEXA SCAN  Completed  . PNA vac Low Risk Adult  Completed    Allergies  Allergen Reactions  . Methocarbamol     Altered thought process  . Penicillins   . Sulfonamide Derivatives   . Amoxicillin Rash    Outpatient Encounter Medications as of 08/12/2019  Medication Sig  . amLODipine (NORVASC) 5 MG tablet TAKE 1 TABLET(5 MG) BY MOUTH DAILY  . furosemide (LASIX) 40 MG  tablet TAKE 1/2 TABLET(20 MG) BY MOUTH DAILY  . Multiple Vitamin (MULTIVITAMIN WITH MINERALS) TABS tablet Take 1 tablet by mouth daily.  . OXYGEN-HELIUM IN Inhale 2 L into the lungs continuous.   No facility-administered encounter medications on file as of 08/12/2019.    Review of Systems  Constitutional: Positive for malaise/fatigue and weight loss. Negative for chills and fever.  HENT: Negative for congestion, hearing loss and sore throat.   Eyes: Negative for blurred vision.       Cataracts, wears glasses  Respiratory: Positive for shortness of breath. Negative for cough, sputum production and wheezing.   Cardiovascular: Negative for chest pain, palpitations and leg swelling.  Gastrointestinal: Negative for abdominal pain, blood in stool, constipation, diarrhea, heartburn, melena, nausea and vomiting.       Decreased appetite  Genitourinary: Negative for dysuria, frequency and urgency.  Musculoskeletal: Negative for back pain, falls, joint pain, myalgias and neck pain.  Skin: Negative for itching and rash.  Neurological: Negative for dizziness, sensory change, focal weakness, loss of consciousness and headaches.  Endo/Heme/Allergies: Does not bruise/bleed easily.  Psychiatric/Behavioral: Positive for depression, hallucinations and memory loss. Negative for substance abuse and suicidal ideas. The patient has insomnia. The patient is not nervous/anxious.        Delusions    Vitals:   08/12/19 1026  BP: (!) 144/68  Pulse: 84  Resp: 16  Temp: (!) 96.8 F (36 C)  SpO2: 91%  Weight: 102 lb (46.3 kg)  Height: _0  (1.499 m)   Body mass index is 20.6 kg/m. Physical Exam Vitals reviewed.  Constitutional:      General: She is not in acute distress.    Appearance: She is not toxic-appearing.     Comments: Frail, chronically ill-appearing female, wearing 2L O2 with portable tank  HENT:     Head: Normocephalic and atraumatic.     Right Ear: External ear normal.     Left Ear:  External ear normal.     Nose: Nose normal.     Mouth/Throat:     Pharynx: Oropharynx is clear. No oropharyngeal exudate or posterior oropharyngeal erythema.  Eyes:     Extraocular Movements: Extraocular movements intact.     Conjunctiva/sclera: Conjunctivae normal.     Pupils: Pupils are equal, round, and reactive to light.     Comments: glasses  Cardiovascular:     Rate and Rhythm: Normal rate and regular rhythm.     Pulses: Normal pulses.     Heart sounds: Normal heart sounds.  Pulmonary:     Effort: Pulmonary effort is normal.     Breath sounds: Normal breath sounds. No wheezing, rhonchi or rales.  Abdominal:     General: Bowel sounds are normal.  Musculoskeletal:        General: Normal range of motion.     Cervical back: Neck supple.     Right lower leg: No edema.     Left lower leg: No edema.  Skin:    General: Skin is warm and dry.     Coloration: Skin is pale.  Neurological:     General: No focal  deficit present.     Mental Status: She is alert.     Cranial Nerves: No cranial nerve deficit.     Coordination: Coordination normal.     Gait: Gait normal.     Deep Tendon Reflexes: Reflexes normal.     Comments: Oriented to year, season, but day and month incorrect, floor of building missed  Psychiatric:     Comments: Tearful talking about her husband spending time with someone else for the last year of his life; delusional     Labs reviewed: Basic Metabolic Panel: Recent Labs    01/16/19 2226  NA 133*  K 3.9  CL 93*  CO2 26  GLUCOSE 102*  BUN 7*  CREATININE 0.53  CALCIUM 9.3   Liver Function Tests: No results for input(s): AST, ALT, ALKPHOS, BILITOT, PROT, ALBUMIN in the last 8760 hours. No results for input(s): LIPASE, AMYLASE in the last 8760 hours. No results for input(s): AMMONIA in the last 8760 hours. CBC: Recent Labs    01/16/19 2226  WBC 6.6  HGB 13.2  HCT 39.5  MCV 96.6  PLT 225   Cardiac Enzymes: No results for input(s): CKTOTAL, CKMB,  CKMBINDEX, TROPONINI in the last 8760 hours. BNP: Invalid input(s): POCBNP No results found for: HGBA1C Lab Results  Component Value Date   TSH 1.76 08/10/2017   No results found for: VITAMINB12 No results found for: FOLATE No results found for: IRON, TIBC, FERRITIN  Imaging and Procedures noted on new patient packet: Reviewed imaging in epic  Assessment/Plan 1. Cognitive and behavioral changes -appears she has dementia with paranoid delusions and hallucinations, but I do want to be sure we are treating her mood first which is clearly depressed -she scored 25/30 on MMSE passing clock but is highly educated  -does have signs of atrophy and chronic ischemic change on prior CT brain -will also plan to check b12, recheck tsh and check vitamin D next time -resources provided to her daughter including coach's playbook, national caregiving foundation guide and [facility] info -recommended that patient not be left home alone and that since she cannot live with her daughter and her family long-term, that they arrange for AL placement in [place] near her daughter's home -pt was agreeable to that at this time  2. Grief -with loss of husband, but also grief over her perception that she lost him long before that when she believes he was cheating on her with a nurse  -recommended grief counseling with [facility] -also will treat depression to see if any of the paranoid delusions and hallucinations she's had clear up with its' treatment to determine whether they're due to severe depression or dementia  3. Depression, recurrent ([place]) - she did agree to medication after some convincing -she does not think she needs counseling though I did recommend grief counseling - mirtazapine (REMERON) 7.5 MG tablet; Take 1 tablet (7.5 mg total) by mouth at bedtime.  Dispense: 30 tablet; Refill: 5  4. Delusional disorder ([place]) -paranoia about late husband having cheated on her, also had  hallucinations that seem related to this delusion -tx depression first and reassess  5. Cor pulmonale, chronic ([facility]) -per records, follows with Dr. Chase Caller and gets O2 thru [facility] and his office, also sees Dr. Gwenlyn Found annually  6. Osteopenia of multiple sites -T score was -2.2 in sept '19 in left femoral neck, had advanced DDD in lumbar spine -not taking vitamins at this time outside of multivitamin  7. History of rickets -  needs vitamin D checked at next visit  Labs/tests ordered: plan to f/u vitamin D, b12, tsh next visit along with cbc, cmp (deferred labs today after she'd been here for 90 mins for this visit till I met with her and then her daughter separately while CMA did MMSE  F/u in 4 wks on depression and dementia  Calvert Charland L. Shonna Deiter, D.O. [facility] [address], [place] [address] Cell Phone (Mon-Fri 8am-5pm):  [phone] On Call:  [phone] & follow prompts after 5pm & weekends Office Phone:  [phone] Office Fax:  [phone]

## 2019-08-12 NOTE — Patient Instructions (Addendum)
I recommend you get some grief counseling with [facility]. Also, I suggest you try mirtazapine 7.5mg  at bedtime for depression and grief. I think it would be a good idea for you to move into an assisted living in [place] where you can be close to your daughter.   Let's meet again in 4 weeks to reassess your mood after taking the medication.

## 2019-08-19 DIAGNOSIS — Z23 Encounter for immunization: Secondary | ICD-10-CM | POA: Diagnosis not present

## 2019-08-22 ENCOUNTER — Other Ambulatory Visit: Payer: Self-pay

## 2019-08-22 ENCOUNTER — Ambulatory Visit (INDEPENDENT_AMBULATORY_CARE_PROVIDER_SITE_OTHER): Payer: Medicare Other | Admitting: Internal Medicine

## 2019-08-22 ENCOUNTER — Encounter: Payer: Self-pay | Admitting: Internal Medicine

## 2019-08-22 VITALS — BP 128/72 | HR 78 | Temp 97.5°F | Ht 59.0 in | Wt 99.1 lb

## 2019-08-22 DIAGNOSIS — F22 Delusional disorders: Secondary | ICD-10-CM

## 2019-08-22 DIAGNOSIS — R4189 Other symptoms and signs involving cognitive functions and awareness: Secondary | ICD-10-CM | POA: Diagnosis not present

## 2019-08-22 DIAGNOSIS — F339 Major depressive disorder, recurrent, unspecified: Secondary | ICD-10-CM | POA: Diagnosis not present

## 2019-08-22 DIAGNOSIS — R4689 Other symptoms and signs involving appearance and behavior: Secondary | ICD-10-CM | POA: Diagnosis not present

## 2019-08-22 NOTE — Progress Notes (Signed)
Location:  [facility] clinic  Provider: Dr. Hollace Kinnier   Goals of Care:  Advanced Directives 08/22/2019  Does Patient Have a Medical Advance Directive? Yes  Type of Advance Directive Out of facility DNR (pink MOST or yellow form)  Does patient want to make changes to medical advance directive? No - Patient declined  Copy of [place] in Chart? -  Would patient like information on creating a medical advance directive? -     Chief Complaint  Patient presents with  . Medical Management of Chronic Issues    follow up  08/12/2019  discuss diagnosis and living and driving situation.     HPI: Patient is a 84 y.o. female seen today for medical management of chronic diseases.    Daughter present for visit.   She has completed her covid vaccine series.   She is eating 3 meals a day. She has trouble drinking water and staying hydrated throughout the day. Has lost 3 pounds since 08/12/2019.   No recent falls or injuries.   Still has trouble remembering to take her medication. Daughter has to remind her.   Sleeping varies. She cannot state how many hours she sleeps or what times she falls asleep.   She is upset she is losing her independence. Does not like depending on her daughter. She is not interested in moving into a assisted living facility in [place]. In addition, she claims she is dealing with her husbands death on her own and plans on doing happy things to cope. She does not mention what these activities are. When a psychiatrist referral is mentioned, she states her children were raised and doing well. She denies any need to see a psychiatrist and cannot express feelings associated with her husbands death. She has repeated numerous times   Yearly eye exam scheduled for April 8th, 2021.   Uses 2LNC. Denies trouble breathing. Saw pulmonary specialist recently.         Past Medical History:  Diagnosis Date  . Allergy   . Chronic fatigue   . COPD (chronic  obstructive pulmonary disease) ([place])   . Depression, recurrent ([place]) 08/12/2019  . Disorder of bone and cartilage, unspecified   . Hypertension   . Oxygen deficiency   . Oxygen dependent   . Pulmonary hypertension ([place])   . Restrictive lung disease   . Rickettsia infection     Past Surgical History:  Procedure Laterality Date  . OOPHORECTOMY      Allergies  Allergen Reactions  . Methocarbamol     Altered thought process  . Penicillins   . Sulfonamide Derivatives   . Amoxicillin Rash    Outpatient Encounter Medications as of 08/22/2019  Medication Sig  . amLODipine (NORVASC) 5 MG tablet TAKE 1 TABLET(5 MG) BY MOUTH DAILY  . furosemide (LASIX) 40 MG tablet TAKE 1/2 TABLET(20 MG) BY MOUTH DAILY  . mirtazapine (REMERON) 7.5 MG tablet Take 1 tablet (7.5 mg total) by mouth at bedtime.  . Multiple Vitamin (MULTIVITAMIN WITH MINERALS) TABS tablet Take 1 tablet by mouth daily.  . OXYGEN-HELIUM IN Inhale 2 L into the lungs continuous.   No facility-administered encounter medications on file as of 08/22/2019.    Review of Systems:  Review of Systems  Constitutional: Negative for activity change, appetite change and fatigue.       Weight loss  HENT: Negative for dental problem, hearing loss and trouble swallowing.   Eyes: Negative for photophobia.       Blurred  vision, cataracts, wears glasses  Respiratory: Positive for shortness of breath. Negative for cough.        Oxygen dependent  Cardiovascular: Negative for chest pain and palpitations.  Gastrointestinal: Negative for abdominal pain, constipation, diarrhea and nausea.  Endocrine: Negative for polydipsia, polyphagia and polyuria.  Genitourinary: Negative for dysuria, frequency, hematuria and vaginal bleeding.  Musculoskeletal: Negative for arthralgias and myalgias.  Skin:       Dry skin  Neurological: Negative for dizziness, weakness and headaches.  Psychiatric/Behavioral: Positive for agitation, behavioral problems,  hallucinations and sleep disturbance. Negative for dysphoric mood. The patient is not nervous/anxious.        Insomnia    Health Maintenance  Topic Date Due  . TETANUS/TDAP  Never done  . INFLUENZA VACCINE  01/08/2019  . DEXA SCAN  Completed  . PNA vac Low Risk Adult  Completed    Physical Exam: Vitals:   08/22/19 1319  BP: 128/72  Pulse: 78  Temp: (!) 97.5 F (36.4 C)  TempSrc: Temporal  SpO2: 99%  Weight: 99 lb 1.6 oz (45 kg)  Height: 4\' 11"  (1.499 m)   Body mass index is 20.02 kg/m. Physical Exam Vitals reviewed.  Constitutional:      General: She is not in acute distress.    Appearance: Normal appearance. She is normal weight.  HENT:     Head: Normocephalic.  Cardiovascular:     Rate and Rhythm: Regular rhythm. Tachycardia present.     Pulses: Normal pulses.     Heart sounds: Normal heart sounds. No murmur.  Pulmonary:     Effort: Pulmonary effort is normal. No respiratory distress.     Breath sounds: Normal breath sounds.  Abdominal:     General: Abdomen is flat. Bowel sounds are normal.     Palpations: Abdomen is soft.  Musculoskeletal:     Right lower leg: No edema.     Left lower leg: No edema.  Skin:    General: Skin is warm and dry.     Capillary Refill: Capillary refill takes less than 2 seconds.  Neurological:     Mental Status: She is alert.  Psychiatric:        Attention and Perception: Attention normal.        Mood and Affect: Mood is depressed. Affect is flat.        Speech: Speech normal.        Behavior: Behavior is withdrawn.        Thought Content: Thought content normal.        Cognition and Memory: Memory is impaired.        Judgment: Judgment is inappropriate.     Comments: Orientated to year, person, situation. Disorientated to day of the week.      Labs reviewed: Basic Metabolic Panel: Recent Labs    01/16/19 2226  NA 133*  K 3.9  CL 93*  CO2 26  GLUCOSE 102*  BUN 7*  CREATININE 0.53  CALCIUM 9.3   Liver Function  Tests: No results for input(s): AST, ALT, ALKPHOS, BILITOT, PROT, ALBUMIN in the last 8760 hours. No results for input(s): LIPASE, AMYLASE in the last 8760 hours. No results for input(s): AMMONIA in the last 8760 hours. CBC: Recent Labs    01/16/19 2226  WBC 6.6  HGB 13.2  HCT 39.5  MCV 96.6  PLT 225   Lipid Panel: No results for input(s): CHOL, HDL, LDLCALC, TRIG, CHOLHDL, LDLDIRECT in the last 8760 hours. No results found for: HGBA1C  Procedures since last visit: No results found.  Assessment/Plan 1. Delusional disorder ([place])  -ongoing, still believes husband cheater on her and hallucinations related to husbands infidelity - she becomes upset when daughter tries to discuss husband with her, tried to avoid daughter during visit - recommend psychiatrist referral  2. Depression, recurrent ([place]) - she appears flat and withdrawn - has recently lost 3 pounds, suspect from memory issues or depression - started remeron <2 weeks ago, no reported side effects - continue remeron at current dose  3. Cognitive and behavioral changes - still appears to have dementia with paranoid delusions - recommend psychiatrist referral to help cope with husbands loss - recommend patient not live alone and instead with daughter - recommend patient should not drive - last discussion with patient discussed plans to move to [place] into a assistive living facility and close to daughter, this visit she appears more driven to be independent, deal with her issues on her own and less dependent on daughter    Labs/tests ordered:  Psychiatrist referral Next appt:  09/08/2019

## 2019-08-22 NOTE — Patient Instructions (Signed)
I recommend you see a psychiatrist to help you move on after your husband's death.    I also suggest you accept some assistance at home to make sure you are safe.  It would be best if you move to [place] to an assisted living facility where you don't have to worry about your medications and appointments because they will help keep track of them.

## 2019-09-08 ENCOUNTER — Encounter: Payer: Self-pay | Admitting: Internal Medicine

## 2019-09-08 ENCOUNTER — Other Ambulatory Visit: Payer: Self-pay

## 2019-09-08 ENCOUNTER — Ambulatory Visit (INDEPENDENT_AMBULATORY_CARE_PROVIDER_SITE_OTHER): Payer: Medicare Other | Admitting: Internal Medicine

## 2019-09-08 VITALS — BP 116/62 | HR 60 | Temp 97.5°F | Ht 59.0 in | Wt 99.2 lb

## 2019-09-08 DIAGNOSIS — I2781 Cor pulmonale (chronic): Secondary | ICD-10-CM

## 2019-09-08 DIAGNOSIS — F22 Delusional disorders: Secondary | ICD-10-CM

## 2019-09-08 DIAGNOSIS — F339 Major depressive disorder, recurrent, unspecified: Secondary | ICD-10-CM

## 2019-09-08 MED ORDER — MIRTAZAPINE 15 MG PO TABS: 15.0000 mg | ORAL_TABLET | Freq: Every day | ORAL | 3 refills | Status: AC

## 2019-09-08 NOTE — Progress Notes (Signed)
Location:  [facility] clinic Provider:  Cam Harnden L. Mariea Clonts, D.O., C.M.D.  Goals of Care:  Advanced Directives 09/08/2019  Does Patient Have a Medical Advance Directive? Yes  Type of Paramedic of [place];Out of facility DNR (pink MOST or yellow form)  Does patient want to make changes to medical advance directive? No - Patient declined  Copy of [place] in Chart? Yes - validated most recent copy scanned in chart (See row information)  Would patient like information on creating a medical advance directive? -  Pre-existing out of facility DNR order (yellow form or pink MOST form) Pink MOST/Yellow Form most recent copy in chart - Physician notified to receive inpatient order     Chief Complaint  Patient presents with  . Medical Management of Chronic Issues    4 week follow up for depression and memory    HPI: Patient is a 84 y.o. female seen today for 4 week follow-up on depression and memory.  Says her mood is doing ok.  Says it will be better when it really is spring and she can get out and about.   She does think she's sleeping better and having some deep sleeps.  Still wakes up for periods of time.  She does not feel all dragged out.  Appetite--pt says is good "her best thing"--not big but she enjoys what she eats.  She's not big on protein for lunches--likes cheeses.  Does like yogurt and cottage cheese.  Wt was 102 3/5, but down to 99 two weeks ago and same today.    She has had some issues with pollen, but otherwise ok.  No major exertion to test it she says.  Some cough from throat congestion.   Her daughter is leaving 4/24.   Pt really wants to stay in her home here.   She has interviewed some home care agencies to stop by twice a day to check on her.   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    There is paperwork for an AL facility in [place].  They asked about if I can do this.   She has not driven.  She would like to feel  confident enough driving to the grocery store and pharmacy.   She likes to go to museums in [place] and a daughter's in [place].    Past Medical History:  Diagnosis Date  . Allergy   . Chronic fatigue   . COPD (chronic obstructive pulmonary disease) ([place])   . Depression, recurrent ([place]) 08/12/2019  . Disorder of bone and cartilage, unspecified   . Hypertension   . Oxygen deficiency   . Oxygen dependent   . Pulmonary hypertension ([place])   . Restrictive lung disease   . Rickettsia infection     Past Surgical History:  Procedure Laterality Date  . OOPHORECTOMY      Allergies  Allergen Reactions  . Methocarbamol     Altered thought process  . Penicillins   . Sulfonamide Derivatives   . Amoxicillin Rash    Outpatient Encounter Medications as of 09/08/2019  Medication Sig  . amLODipine (NORVASC) 5 MG tablet TAKE 1 TABLET(5 MG) BY MOUTH DAILY  . furosemide (LASIX) 40 MG tablet TAKE 1/2 TABLET(20 MG) BY MOUTH DAILY  . mirtazapine (REMERON) 7.5 MG tablet Take 1 tablet (7.5 mg total) by mouth at bedtime.  . Multiple Vitamin (MULTIVITAMIN WITH MINERALS) TABS tablet Take 1 tablet by mouth daily.  Donell Sievert IN Inhale 2  L into the lungs continuous.   No facility-administered encounter medications on file as of 09/08/2019.    Review of Systems:  Review of Systems  Constitutional: Positive for malaise/fatigue and weight loss. Negative for chills and fever.  HENT: Negative for congestion and sore throat.   Eyes:       Glasses--has eye appt coming up  Respiratory: Negative for cough and shortness of breath.        Wears her O2 for restrictive lung disease  Cardiovascular: Negative for chest pain, palpitations and leg swelling.  Gastrointestinal: Negative for abdominal pain, blood in stool, constipation, diarrhea and melena.  Genitourinary: Negative for dysuria.  Musculoskeletal: Negative for falls and joint pain.  Skin: Negative for rash.  Neurological: Negative for  dizziness and loss of consciousness.  Endo/Heme/Allergies: Bruises/bleeds easily.  Psychiatric/Behavioral: Positive for depression and memory loss. Negative for hallucinations and suicidal ideas.       Sleep improved, no mention of delusions today, spirits quite a bit better    Health Maintenance  Topic Date Due  . TETANUS/TDAP  Never done  . INFLUENZA VACCINE  01/08/2020  . DEXA SCAN  Completed  . PNA vac Low Risk Adult  Completed    Physical Exam: Vitals:   09/08/19 1043  BP: 116/62  Pulse: 60  Temp: (!) 97.5 F (36.4 C)  SpO2: 98%  Weight: 99 lb 3.2 oz (45 kg)  Height: 4\' 11"  (1.499 m)   Body mass index is 20.04 kg/m. Physical Exam Vitals reviewed.  Constitutional:      General: She is not in acute distress.    Appearance: She is not toxic-appearing.  HENT:     Head: Normocephalic and atraumatic.  Eyes:     Comments: glasses  Cardiovascular:     Rate and Rhythm: Normal rate and regular rhythm.     Heart sounds: No murmur.  Pulmonary:     Effort: Pulmonary effort is normal.     Breath sounds: Normal breath sounds. No wheezing, rhonchi or rales.     Comments: Wearing her portable oxygen Abdominal:     General: Bowel sounds are normal.  Musculoskeletal:        General: Normal range of motion.     Cervical back: Neck supple.     Right lower leg: No edema.     Left lower leg: No edema.  Skin:    General: Skin is warm and dry.  Neurological:     General: No focal deficit present.     Mental Status: She is alert.     Motor: No weakness.     Gait: Gait normal.  Psychiatric:        Mood and Affect: Mood normal.        Behavior: Behavior normal.        Thought Content: Thought content normal.        Judgment: Judgment normal.     Labs reviewed: Basic Metabolic Panel: Recent Labs    01/16/19 2226  NA 133*  K 3.9  CL 93*  CO2 26  GLUCOSE 102*  BUN 7*  CREATININE 0.53  CALCIUM 9.3   Liver Function Tests: No results for input(s): AST, ALT, ALKPHOS,  BILITOT, PROT, ALBUMIN in the last 8760 hours. No results for input(s): LIPASE, AMYLASE in the last 8760 hours. No results for input(s): AMMONIA in the last 8760 hours. CBC: Recent Labs    01/16/19 2226  WBC 6.6  HGB 13.2  HCT 39.5  MCV 96.6  PLT  225   Lipid Panel: No results for input(s): CHOL, HDL, LDLCALC, TRIG, CHOLHDL, LDLDIRECT in the last 8760 hours. No results found for: HGBA1C  Procedures since last visit: No results found.  Assessment/Plan 1. Depression, recurrent ([place]) - doing better on 7.5mg  mirtazapine, but still waking up at times at night - will try going up on remeron to see if it helps with that and also if it may help appetite more b/c she has not gained any weight even with her daughter here helping her - mirtazapine (REMERON) 15 MG tablet; Take 1 tablet (15 mg total) by mouth at bedtime.  Dispense: 30 tablet; Refill: 3  2. Delusional disorder ([place]) -she did not say anything suggestive of this today, but has had delusion of her husband cheating on her with a nurse and that he is still alive  -has underlying major neurocognitive disorder with mmse 25/30 (down from perfect 3 yrs ago) -discussed that I recommend AL in [place] near her daughter -they have been interviewing home care agencies and plan to get one started before her daughter leaves 4/24  -also discussed that I do not recommend driving due to her cognitive changes--it sounds like she is afraid to b/c it's been so long -hopefully having the home care aid to drive her places will solve this  3. Cor pulmonale, chronic ([facility]) -continue oxygen therapy  Labs/tests ordered:   Lab Orders  No laboratory test(s) ordered today  I need to check labs next time  Next appt:  09/22/2019 for one more visit before her daughter leaves--they may drop off an FL2 for completion for AL placement also in b/w  Demetreus Lothamer L. Gianpaolo Mindel, D.O. [facility] [address], [place]  [address] Cell Phone (Mon-Fri 8am-5pm):  [phone] On Call:  [phone] & follow prompts after 5pm & weekends Office Phone:  [phone] Office Fax:  [phone]

## 2019-09-15 DIAGNOSIS — H2511 Age-related nuclear cataract, right eye: Secondary | ICD-10-CM | POA: Diagnosis not present

## 2019-09-15 DIAGNOSIS — H2512 Age-related nuclear cataract, left eye: Secondary | ICD-10-CM | POA: Diagnosis not present

## 2019-09-15 DIAGNOSIS — H40029 Open angle with borderline findings, high risk, unspecified eye: Secondary | ICD-10-CM | POA: Diagnosis not present

## 2019-09-18 ENCOUNTER — Emergency Department (HOSPITAL_COMMUNITY): Payer: Medicare Other

## 2019-09-18 ENCOUNTER — Inpatient Hospital Stay (HOSPITAL_COMMUNITY)
Admission: EM | Admit: 2019-09-18 | Discharge: 2019-10-08 | DRG: 296 | Disposition: E | Payer: Medicare Other | Attending: Critical Care Medicine | Admitting: Critical Care Medicine

## 2019-09-18 DIAGNOSIS — E872 Acidosis: Secondary | ICD-10-CM | POA: Diagnosis present

## 2019-09-18 DIAGNOSIS — I469 Cardiac arrest, cause unspecified: Secondary | ICD-10-CM | POA: Diagnosis not present

## 2019-09-18 DIAGNOSIS — I272 Pulmonary hypertension, unspecified: Secondary | ICD-10-CM | POA: Diagnosis present

## 2019-09-18 DIAGNOSIS — R680 Hypothermia, not associated with low environmental temperature: Secondary | ICD-10-CM | POA: Diagnosis present

## 2019-09-18 DIAGNOSIS — R4020 Unspecified coma: Secondary | ICD-10-CM | POA: Diagnosis not present

## 2019-09-18 DIAGNOSIS — G931 Anoxic brain damage, not elsewhere classified: Secondary | ICD-10-CM | POA: Diagnosis present

## 2019-09-18 DIAGNOSIS — I959 Hypotension, unspecified: Secondary | ICD-10-CM | POA: Diagnosis not present

## 2019-09-18 DIAGNOSIS — K7200 Acute and subacute hepatic failure without coma: Secondary | ICD-10-CM | POA: Diagnosis not present

## 2019-09-18 DIAGNOSIS — I10 Essential (primary) hypertension: Secondary | ICD-10-CM | POA: Diagnosis present

## 2019-09-18 DIAGNOSIS — G253 Myoclonus: Secondary | ICD-10-CM | POA: Diagnosis present

## 2019-09-18 DIAGNOSIS — J449 Chronic obstructive pulmonary disease, unspecified: Secondary | ICD-10-CM | POA: Diagnosis present

## 2019-09-18 DIAGNOSIS — G936 Cerebral edema: Secondary | ICD-10-CM | POA: Diagnosis present

## 2019-09-18 DIAGNOSIS — R5382 Chronic fatigue, unspecified: Secondary | ICD-10-CM | POA: Diagnosis present

## 2019-09-18 DIAGNOSIS — R7402 Elevation of levels of lactic acid dehydrogenase (LDH): Secondary | ICD-10-CM | POA: Diagnosis present

## 2019-09-18 DIAGNOSIS — R404 Transient alteration of awareness: Secondary | ICD-10-CM | POA: Diagnosis not present

## 2019-09-18 DIAGNOSIS — F339 Major depressive disorder, recurrent, unspecified: Secondary | ICD-10-CM | POA: Diagnosis present

## 2019-09-18 DIAGNOSIS — J302 Other seasonal allergic rhinitis: Secondary | ICD-10-CM | POA: Diagnosis present

## 2019-09-18 DIAGNOSIS — Z8249 Family history of ischemic heart disease and other diseases of the circulatory system: Secondary | ICD-10-CM

## 2019-09-18 DIAGNOSIS — Z515 Encounter for palliative care: Secondary | ICD-10-CM | POA: Diagnosis present

## 2019-09-18 DIAGNOSIS — R739 Hyperglycemia, unspecified: Secondary | ICD-10-CM | POA: Diagnosis present

## 2019-09-18 DIAGNOSIS — Z882 Allergy status to sulfonamides status: Secondary | ICD-10-CM

## 2019-09-18 DIAGNOSIS — R579 Shock, unspecified: Secondary | ICD-10-CM | POA: Diagnosis not present

## 2019-09-18 DIAGNOSIS — R4182 Altered mental status, unspecified: Secondary | ICD-10-CM | POA: Diagnosis not present

## 2019-09-18 DIAGNOSIS — Z9981 Dependence on supplemental oxygen: Secondary | ICD-10-CM

## 2019-09-18 DIAGNOSIS — J9621 Acute and chronic respiratory failure with hypoxia: Secondary | ICD-10-CM | POA: Diagnosis not present

## 2019-09-18 DIAGNOSIS — R52 Pain, unspecified: Secondary | ICD-10-CM

## 2019-09-18 DIAGNOSIS — Z90722 Acquired absence of ovaries, bilateral: Secondary | ICD-10-CM

## 2019-09-18 DIAGNOSIS — Z83438 Family history of other disorder of lipoprotein metabolism and other lipidemia: Secondary | ICD-10-CM

## 2019-09-18 DIAGNOSIS — E871 Hypo-osmolality and hyponatremia: Secondary | ICD-10-CM | POA: Diagnosis present

## 2019-09-18 DIAGNOSIS — I2781 Cor pulmonale (chronic): Secondary | ICD-10-CM | POA: Diagnosis present

## 2019-09-18 DIAGNOSIS — Z881 Allergy status to other antibiotic agents status: Secondary | ICD-10-CM

## 2019-09-18 DIAGNOSIS — J984 Other disorders of lung: Secondary | ICD-10-CM | POA: Diagnosis present

## 2019-09-18 DIAGNOSIS — Z66 Do not resuscitate: Secondary | ICD-10-CM | POA: Diagnosis present

## 2019-09-18 DIAGNOSIS — Z880 Allergy status to penicillin: Secondary | ICD-10-CM

## 2019-09-18 DIAGNOSIS — Z9911 Dependence on respirator [ventilator] status: Secondary | ICD-10-CM

## 2019-09-18 DIAGNOSIS — Z79899 Other long term (current) drug therapy: Secondary | ICD-10-CM

## 2019-09-18 DIAGNOSIS — Z7289 Other problems related to lifestyle: Secondary | ICD-10-CM

## 2019-09-18 DIAGNOSIS — Z4682 Encounter for fitting and adjustment of non-vascular catheter: Secondary | ICD-10-CM | POA: Diagnosis not present

## 2019-09-18 DIAGNOSIS — R0689 Other abnormalities of breathing: Secondary | ICD-10-CM | POA: Diagnosis not present

## 2019-09-18 DIAGNOSIS — Z8619 Personal history of other infectious and parasitic diseases: Secondary | ICD-10-CM

## 2019-09-18 DIAGNOSIS — Z888 Allergy status to other drugs, medicaments and biological substances status: Secondary | ICD-10-CM

## 2019-09-18 DIAGNOSIS — R040 Epistaxis: Secondary | ICD-10-CM

## 2019-09-18 DIAGNOSIS — Z03818 Encounter for observation for suspected exposure to other biological agents ruled out: Secondary | ICD-10-CM | POA: Diagnosis not present

## 2019-09-18 DIAGNOSIS — N179 Acute kidney failure, unspecified: Secondary | ICD-10-CM | POA: Diagnosis not present

## 2019-09-18 DIAGNOSIS — Z20822 Contact with and (suspected) exposure to covid-19: Secondary | ICD-10-CM | POA: Diagnosis present

## 2019-09-18 DIAGNOSIS — F22 Delusional disorders: Secondary | ICD-10-CM | POA: Diagnosis present

## 2019-09-18 DIAGNOSIS — R000 Tachycardia, unspecified: Secondary | ICD-10-CM | POA: Diagnosis not present

## 2019-09-18 LAB — COMPREHENSIVE METABOLIC PANEL
ALT: 354 U/L — ABNORMAL HIGH (ref 0–44)
AST: 610 U/L — ABNORMAL HIGH (ref 15–41)
Albumin: 3.1 g/dL — ABNORMAL LOW (ref 3.5–5.0)
Alkaline Phosphatase: 83 U/L (ref 38–126)
Anion gap: 21 — ABNORMAL HIGH (ref 5–15)
BUN: 19 mg/dL (ref 8–23)
CO2: 16 mmol/L — ABNORMAL LOW (ref 22–32)
Calcium: 7.8 mg/dL — ABNORMAL LOW (ref 8.9–10.3)
Chloride: 91 mmol/L — ABNORMAL LOW (ref 98–111)
Creatinine, Ser: 1.12 mg/dL — ABNORMAL HIGH (ref 0.44–1.00)
GFR calc Af Amer: 53 mL/min — ABNORMAL LOW (ref >60)
GFR calc non Af Amer: 46 mL/min — ABNORMAL LOW (ref >60)
Glucose, Bld: 164 mg/dL — ABNORMAL HIGH (ref 70–99)
Potassium: 4.9 mmol/L (ref 3.5–5.1)
Sodium: 128 mmol/L — ABNORMAL LOW (ref 135–145)
Total Bilirubin: 1.3 mg/dL — ABNORMAL HIGH (ref 0.3–1.2)
Total Protein: 5.2 g/dL — ABNORMAL LOW (ref 6.5–8.1)

## 2019-09-18 LAB — I-STAT CHEM 8, ED
BUN: 21 mg/dL (ref 8–23)
Calcium, Ion: 0.93 mmol/L — ABNORMAL LOW (ref 1.15–1.40)
Chloride: 94 mmol/L — ABNORMAL LOW (ref 98–111)
Creatinine, Ser: 0.9 mg/dL (ref 0.44–1.00)
Glucose, Bld: 154 mg/dL — ABNORMAL HIGH (ref 70–99)
HCT: 42 % (ref 36.0–46.0)
Hemoglobin: 14.3 g/dL (ref 12.0–15.0)
Potassium: 4.8 mmol/L (ref 3.5–5.1)
Sodium: 126 mmol/L — ABNORMAL LOW (ref 135–145)
TCO2: 19 mmol/L — ABNORMAL LOW (ref 22–32)

## 2019-09-18 LAB — RAPID URINE DRUG SCREEN, HOSP PERFORMED
Amphetamines: NOT DETECTED
Barbiturates: NOT DETECTED
Benzodiazepines: NOT DETECTED
Cocaine: NOT DETECTED
Opiates: NOT DETECTED
Tetrahydrocannabinol: NOT DETECTED

## 2019-09-18 LAB — CBC
HCT: 43.2 % (ref 36.0–46.0)
Hemoglobin: 13.6 g/dL (ref 12.0–15.0)
MCH: 32 pg (ref 26.0–34.0)
MCHC: 31.5 g/dL (ref 30.0–36.0)
MCV: 101.6 fL — ABNORMAL HIGH (ref 80.0–100.0)
Platelets: 213 10*3/uL (ref 150–400)
RBC: 4.25 MIL/uL (ref 3.87–5.11)
RDW: 12.2 % (ref 11.5–15.5)
WBC: 9.8 10*3/uL (ref 4.0–10.5)
nRBC: 0 % (ref 0.0–0.2)

## 2019-09-18 LAB — URINALYSIS, ROUTINE W REFLEX MICROSCOPIC
Bilirubin Urine: NEGATIVE
Glucose, UA: NEGATIVE mg/dL
Ketones, ur: 5 mg/dL — AB
Leukocytes,Ua: NEGATIVE
Nitrite: NEGATIVE
Protein, ur: >=300 mg/dL — AB
Specific Gravity, Urine: 1.012 (ref 1.005–1.030)
pH: 6 (ref 5.0–8.0)

## 2019-09-18 LAB — TROPONIN I (HIGH SENSITIVITY): Troponin I (High Sensitivity): 9 ng/L (ref <18)

## 2019-09-18 LAB — ETHANOL: Alcohol, Ethyl (B): <10 mg/dL (ref <10)

## 2019-09-18 LAB — POCT I-STAT 7, (LYTES, BLD GAS, ICA,H+H)
Acid-base deficit: 7 mmol/L — ABNORMAL HIGH (ref 0.0–2.0)
Bicarbonate: 22.2 mmol/L (ref 20.0–28.0)
Calcium, Ion: 1.17 mmol/L (ref 1.15–1.40)
HCT: 49 % — ABNORMAL HIGH (ref 36.0–46.0)
Hemoglobin: 16.7 g/dL — ABNORMAL HIGH (ref 12.0–15.0)
O2 Saturation: 90 %
Patient temperature: 92.9
Potassium: 4.6 mmol/L (ref 3.5–5.1)
Sodium: 126 mmol/L — ABNORMAL LOW (ref 135–145)
TCO2: 24 mmol/L (ref 22–32)
pCO2 arterial: 52.6 mmHg — ABNORMAL HIGH (ref 32.0–48.0)
pH, Arterial: 7.216 — ABNORMAL LOW (ref 7.350–7.450)
pO2, Arterial: 60 mmHg — ABNORMAL LOW (ref 83.0–108.0)

## 2019-09-18 LAB — RESPIRATORY PANEL BY RT PCR (FLU A&B, COVID)
Influenza A by PCR: NEGATIVE
Influenza B by PCR: NEGATIVE
SARS Coronavirus 2 by RT PCR: NEGATIVE

## 2019-09-18 LAB — PROTIME-INR
INR: 1.4 — ABNORMAL HIGH (ref 0.8–1.2)
Prothrombin Time: 17.3 seconds — ABNORMAL HIGH (ref 11.4–15.2)

## 2019-09-18 MED ORDER — SODIUM CHLORIDE 0.9 % IV BOLUS
1000.0000 mL | Freq: Once | INTRAVENOUS | Status: AC
  Administered 2019-09-18: 1000 mL via INTRAVENOUS

## 2019-09-18 MED ORDER — PROPOFOL 1000 MG/100ML IV EMUL
0.0000 ug/kg/min | INTRAVENOUS | Status: DC
  Administered 2019-09-18: 5 ug/kg/min via INTRAVENOUS
  Filled 2019-09-18: qty 100

## 2019-09-18 MED ORDER — CEFAZOLIN SODIUM-DEXTROSE 1-4 GM/50ML-% IV SOLN
1.0000 g | Freq: Three times a day (TID) | INTRAVENOUS | Status: DC
  Administered 2019-09-19: 1 g via INTRAVENOUS
  Filled 2019-09-18 (×3): qty 50

## 2019-09-18 MED ORDER — FENTANYL 2500MCG IN NS 250ML (10MCG/ML) PREMIX INFUSION
25.0000 ug/h | INTRAVENOUS | Status: DC
  Administered 2019-09-18: 25 ug/h via INTRAVENOUS
  Filled 2019-09-18: qty 250

## 2019-09-18 MED ORDER — FENTANYL BOLUS VIA INFUSION
25.0000 ug | INTRAVENOUS | Status: DC | PRN
  Filled 2019-09-18: qty 25

## 2019-09-18 MED ORDER — NOREPINEPHRINE 4 MG/250ML-% IV SOLN
0.0000 ug/min | INTRAVENOUS | Status: DC
  Administered 2019-09-18: 5 ug/min via INTRAVENOUS
  Administered 2019-09-19: 10 ug/min via INTRAVENOUS
  Filled 2019-09-18: qty 250

## 2019-09-18 MED ORDER — DOCUSATE SODIUM 100 MG PO CAPS: 100.0000 mg | ORAL_CAPSULE | Freq: Two times a day (BID) | ORAL | Status: DC | PRN

## 2019-09-18 MED ORDER — LEVETIRACETAM IN NACL 1000 MG/100ML IV SOLN: 1000.0000 mg | INTRAVENOUS | Status: AC

## 2019-09-18 MED ORDER — SODIUM CHLORIDE 0.9 % IV SOLN: 2000.0000 mg | Freq: Once | INTRAVENOUS | Status: DC

## 2019-09-18 MED ORDER — POLYETHYLENE GLYCOL 3350 17 G PO PACK: 17.0000 g | PACK | Freq: Every day | ORAL | Status: DC | PRN

## 2019-09-18 MED ORDER — POLYETHYLENE GLYCOL 3350 17 G PO PACK: 17.0000 g | PACK | Freq: Every day | ORAL | Status: DC

## 2019-09-18 MED ORDER — ETOMIDATE 2 MG/ML IV SOLN
INTRAVENOUS | Status: AC | PRN
  Administered 2019-09-18: 10 mg via INTRAVENOUS

## 2019-09-18 MED ORDER — SUCCINYLCHOLINE CHLORIDE 20 MG/ML IJ SOLN
INTRAMUSCULAR | Status: AC | PRN
  Administered 2019-09-18: 75 mg via INTRAVENOUS

## 2019-09-18 MED ORDER — FENTANYL CITRATE (PF) 100 MCG/2ML IJ SOLN
25.0000 ug | Freq: Once | INTRAMUSCULAR | Status: AC
  Administered 2019-09-18: 25 ug via INTRAVENOUS

## 2019-09-18 MED ORDER — HEPARIN SODIUM (PORCINE) 5000 UNIT/ML IJ SOLN: 5000.0000 [IU] | Freq: Three times a day (TID) | INTRAMUSCULAR | Status: DC

## 2019-09-18 MED ORDER — PANTOPRAZOLE SODIUM 40 MG IV SOLR
40.0000 mg | Freq: Every day | INTRAVENOUS | Status: DC
  Filled 2019-09-18: qty 40

## 2019-09-18 MED ORDER — FENTANYL 2500MCG IN NS 250ML (10MCG/ML) PREMIX INFUSION
25.0000 ug/h | INTRAVENOUS | Status: DC
  Administered 2019-09-18: 50 ug/h via INTRAVENOUS

## 2019-09-18 MED ORDER — DOCUSATE SODIUM 50 MG/5ML PO LIQD: 100.0000 mg | Freq: Two times a day (BID) | ORAL | Status: DC

## 2019-09-18 NOTE — Progress Notes (Addendum)
RT unable to obtain ABG at this time. RT will reattempt once more sedation is given.

## 2019-09-18 NOTE — Code Documentation (Signed)
Pt arrived with pulse present in radius

## 2019-09-18 NOTE — Progress Notes (Signed)
   09/11/2019 2005  Clinical Encounter Type  Visited With Family;Health care provider  Visit Type Initial;Critical Care;ED  Referral From Nurse  Stress Factors  Family Stress Factors Health changes   Chaplain responded to a post-CPR in the Emergency Dept. Chaplain met with patient's daughter, Judson Roch, in the hallway outside of patient's room. Chaplain extended hospitality. Chaplain offered support. Chaplain introduced spiritual care services.  Spiritual care services available as needed.   Jeri Lager, Chaplain

## 2019-09-18 NOTE — Code Documentation (Signed)
Pt arrived with [facility] airway, IO in R tib and IV r forarm

## 2019-09-18 NOTE — H&P (Addendum)
NAME:  Julia Lucero, MRN:  [id number], DOB:  09-15-1935, LOS: 0 ADMISSION DATE:  09/10/2019, CONSULTATION DATE:  09/10/2019 REFERRING MD:  EDP, CHIEF COMPLAINT:  Cardiac arrest   Brief History   84 y.o. F with PMH of pulmonary HTN, COPD on 2L home O2 who was found unresponsive in a chair by her daughter.  Pulseless on EMS arrival and pt underwent 30 minutes of CPR before ROSC achieved.  Intubated and head CT negative. PCCM consulted for admission  History of present illness   84 y.o. F with PMH of pulmonary HTN, COPD on 2L home O2 who was found unresponsive in a chair by her daughter.  Her daughter had just seen her approximately 45 minutes before.  She was in her usual state of health except for some allergies from pollen, afebrile and vaccinated against COVID-19.  When EMS arrived patient was asystolic then converted to PEA, approximately 30 minutes of CPR with 6 rounds of epi for ROSC was achieved.  Apparently multiple intubation attempts.  The emergency department, patient has been unresponsive with myoclonus and epistaxis.  She was initially hypothermic and hypotensive which improved with peripheral Levophed.  EKG without signs of acute ischemia and negative troponin.  Head CT with no acute bleed, positive for signs of anoxic injury.  Work-up without signs of acute infection, ABG pending, revealed elevated creatinine and transaminases.   Patient's daughters at the bedside, she states that she is not had specific end-of-life discussions with her mother.  Prognosis discussed and she is comfortable with DNR status   Past Medical History   has a past medical history of Allergy, Chronic fatigue, COPD (chronic obstructive pulmonary disease) ([place]), Depression, recurrent ([place]) (08/12/2019), Disorder of bone and cartilage, unspecified, Hypertension, Oxygen deficiency, Oxygen dependent, Pulmonary hypertension ([place]), Restrictive lung disease, and Rickettsia infection.   Significant Hospital Events     4/11 admit to PCCM  Consults:    Procedures:  4/11 ETT   Significant Diagnostic Tests:  09/20/2019 CXR>> marked cardiomegaly, no acute findings 4/11 CT head>> diffuse cerebral edema compatible with anoxic injury, early intracranial mass-effect no hemorrhage  Micro Data:  4/11 COVID-19 and respiratory viral panel>> negative  Antimicrobials:   4/11 Ancef  Interim history/subjective:  Pt hemodynamically stable, but with persistent myoclonus  Objective   Blood pressure (!) 116/56, pulse 67, temperature (!) 92.9 F (33.8 C), resp. rate 19, height 5' (1.524 m), SpO2 97 %.    Vent Mode: PRVC FiO2 (%):  [100 %] 100 % Set Rate:  [16 bmp] 16 bmp Vt Set:  [360 mL] 360 mL PEEP:  [5 cmH20] 5 cmH20 Plateau Pressure:  [21 cmH20] 21 cmH20   Intake/Output Summary (Last 24 hours) at 09/17/2019 2238 Last data filed at 09/25/2019 1944 Gross per 24 hour  Intake 500 ml  Output --  Net 500 ml   There were no vitals filed for this visit.  General: Elderly, chronically ill-appearing female unresponsive HEENT: MM pink/moist, significant bilateral epistaxis ET tube in place Neuro: No purposeful movement, persistent generalized myoclonus, pupils fixed, some spontaneous respirations, corneal reflex present on the right absent on the left, induced myoclonus also present LLE CV: s1s2 rrr, no m/r/g PULM: Intubated on full vent support, lungs clear bilaterally GI: soft, bsx4 active  Extremities: warm/dry, no edema  Skin: no rashes or lesions   Resolved Hospital Problem list     Assessment & Plan:   Unwitnessed cardiac arrest, asystole and PEA -Unknown downtime with 30 minutes of CPR and presenting  hypothermia therefore patient is not a candidate for TTM P: -Admit for supportive care -EEG -Echocardiogram -Continue peripheral Levophed to maintain MAP greater than 65 --Maintain full vent support with SAT/SBT as tolerated -titrate Vent setting to maintain SpO2 greater than or equal to  90%. -HOB elevated 30 degrees. -Plateau pressures less than 30 cm H20.  -Follow chest x-ray, ABG prn.   -Bronchial hygiene and RT/bronchodilator protocol. -Very poor prognosis, this was discussed with patient's daughter at the bedside along with the option of transitioning to comfort care   Encephalopathy with myoclonus -Likely secondary to anoxic brain injury P: -Loaded with Keppra 2 g and propofol started  -EEG   Epistaxis -Patient is not on blood thinners, bilateral Rhino Rocket's placed in the ED P: -Start Ancef for prophylactic staph and strep coverage while packing in place   Acute kidney injury and elevated transaminases -Likely secondary to shock in the setting of prolonged cardiac arrest -Creatinine mildly elevated at 1.1 with K 4.9 P: -Monitor urine output and electrolytes, repeat CMP in the morning -Avoid nephrotoxins   Pulmonary hypertension -Possibly related to thoracic cage defect from childhood rickets -Sees Dr. Chase Caller -On home O2  Best practice:  Diet: N.p.o.  pain/Anxiety/Delirium protocol (if indicated): Propofol, fentanyl VAP protocol (if indicated): HOB 30 degrees and daily SBT DVT prophylaxis: Heparin GI prophylaxis: Protonix Glucose control: SSI Mobility: Bedrest Code Status: DNR Family Communication: Prognosis discussed with patient's daughter at the bedside Disposition: ICU  Labs   CBC: Recent Labs  Lab 09/27/2019 2021 09/21/2019 2049 09/21/2019 2220  WBC 9.8  --   --   HGB 13.6 14.3 16.7*  HCT 43.2 42.0 49.0*  MCV 101.6*  --   --   PLT 213  --   --     Basic Metabolic Panel: Recent Labs  Lab 10/01/2019 2021 09/20/2019 2049 09/16/2019 2220  NA 128* 126* 126*  K 4.9 4.8 4.6  CL 91* 94*  --   CO2 16*  --   --   GLUCOSE 164* 154*  --   BUN 19 21  --   CREATININE 1.12* 0.90  --   CALCIUM 7.8*  --   --    GFR: Estimated Creatinine Clearance: 34.2 mL/min (by C-G formula based on SCr of 0.9 mg/dL). Recent Labs  Lab 10/02/2019 2021   WBC 9.8    Liver Function Tests: Recent Labs  Lab 09/30/2019 2021  AST 610*  ALT 354*  ALKPHOS 83  BILITOT 1.3*  PROT 5.2*  ALBUMIN 3.1*   No results for input(s): LIPASE, AMYLASE in the last 168 hours. No results for input(s): AMMONIA in the last 168 hours.  ABG    Component Value Date/Time   PHART 7.216 (L) 09/09/2019 2220   PCO2ART 52.6 (H) 09/21/2019 2220   PO2ART 60.0 (L) 09/21/2019 2220   HCO3 22.2 09/21/2019 2220   TCO2 24 09/10/2019 2220   ACIDBASEDEF 7.0 (H) 10/07/2019 2220   O2SAT 90.0 09/18/2019 2220     Coagulation Profile: Recent Labs  Lab 09/18/19 2021  INR 1.4*    Cardiac Enzymes: No results for input(s): CKTOTAL, CKMB, CKMBINDEX, TROPONINI in the last 168 hours.  HbA1C: No results found for: HGBA1C  CBG: No results for input(s): GLUCAP in the last 168 hours.  Review of Systems:   Unable to obtain secondary to encephalopathy  Past Medical History  She,  has a past medical history of Allergy, Chronic fatigue, COPD (chronic obstructive pulmonary disease) ([place]), Depression, recurrent ([place]) (08/12/2019), Disorder of  bone and cartilage, unspecified, Hypertension, Oxygen deficiency, Oxygen dependent, Pulmonary hypertension ([place]), Restrictive lung disease, and Rickettsia infection.   Surgical History    Past Surgical History:  Procedure Laterality Date  . OOPHORECTOMY       Social History   reports that she has never smoked. She has never used smokeless tobacco. She reports current alcohol use. She reports that she does not use drugs.   Family History   Her family history includes Arthritis in her sister; Colon cancer in her mother; Diabetes in her father; Heart disease in her father, maternal grandfather, mother, paternal grandfather, and paternal grandmother; Hyperlipidemia in her brother and brother; Hypertension in her brother and mother.   Allergies Allergies  Allergen Reactions  . Methocarbamol     Altered thought process  . Penicillins    . Sulfonamide Derivatives   . Amoxicillin Rash     Home Medications  Prior to Admission medications   Medication Sig Start Date End Date Taking? Authorizing Provider  amLODipine (NORVASC) 5 MG tablet TAKE 1 TABLET(5 MG) BY MOUTH DAILY 07/11/19   Lorretta Harp, MD  furosemide (LASIX) 40 MG tablet TAKE 1/2 TABLET(20 MG) BY MOUTH DAILY 07/11/19   Lorretta Harp, MD  mirtazapine (REMERON) 15 MG tablet Take 1 tablet (15 mg total) by mouth at bedtime. 09/08/19   Reed, Tiffany L, DO  Multiple Vitamin (MULTIVITAMIN WITH MINERALS) TABS tablet Take 1 tablet by mouth daily.    [provider]  OXYGEN-HELIUM IN Inhale 2 L into the lungs continuous.    [provider]     Critical care time: 62 minutes    CRITICAL CARE Performed by: Otilio Carpen Deandrae Wajda   Total critical care time: 62 minutes  Critical care time was exclusive of separately billable procedures and treating other patients.  Critical care was necessary to treat or prevent imminent or life-threatening deterioration.  Critical care was time spent personally by me on the following activities: development of treatment plan with patient and/or surrogate as well as nursing, discussions with consultants, evaluation of patient's response to treatment, examination of patient, obtaining history from patient or surrogate, ordering and performing treatments and interventions, ordering and review of laboratory studies, ordering and review of radiographic studies, pulse oximetry and re-evaluation of patient's condition.  Otilio Carpen Yunis Voorheis, PA-C

## 2019-09-18 NOTE — ED Notes (Signed)
Pt family at bedside, comfort measures given

## 2019-09-18 NOTE — ED Provider Notes (Signed)
[facility] EMERGENCY DEPARTMENT Provider Note   CSN: [id number] Arrival date & time: 10/06/2019  1931     History Chief Complaint  Patient presents with  . Cardiac Arrest    Julia Lucero is a 84 y.o. female.  Patient presents s/p CPR/arrest, with ROSC after approximately 35 minutes of CPR and EMS efforts. EMS indicates family member had left patient at home, pt seemed fine, at baseline, earlier today. When family returned, pt sitting, head back, unresponsive, not breathing. EMS was called - initial rhythm asystole, cpr started, bag ventilation. Then rhythm PEA. cpr continued. EMS tried nasal and then endotracheal intubation - not successful, and placed [facility] airway. Total 8 epi. cbg was 166. EMS was about to terminate efforts when return of pulses after ~30-40 minutes of CPR. Pt unresponsive - level 5 caveat.   The history is provided by the patient and the EMS personnel. The history is limited by the condition of the patient.  Cardiac Arrest      Past Medical History:  Diagnosis Date  . Allergy   . Chronic fatigue   . COPD (chronic obstructive pulmonary disease) ([place])   . Depression, recurrent ([place]) 08/12/2019  . Disorder of bone and cartilage, unspecified   . Hypertension   . Oxygen deficiency   . Oxygen dependent   . Pulmonary hypertension ([place])   . Restrictive lung disease   . Rickettsia infection     Patient Active Problem List   Diagnosis Date Noted  . Delusional disorder ([place]) 08/12/2019  . Depression, recurrent ([place]) 08/12/2019  . Grief 08/12/2019  . Cognitive and behavioral changes 08/12/2019  . Need for prophylactic vaccination against Streptococcus pneumoniae (pneumococcus) 03/14/2016  . Pectus excavatum 09/10/2015  . Seasonal and perennial allergic rhinitis 07/24/2015  . Urticaria 03/16/2015  . Angioedema 03/13/2015  . Other chest pain 01/18/2015  . Bibasilar crackles 09/08/2014  . Dyspnea and respiratory abnormality 09/08/2014  . Cor  pulmonale, chronic ([place]) 09/08/2014  . Hx of adenomatous colonic polyps 07/29/2012  . History of rickets 07/27/2012  . Kyphoscoliosis 06/28/2010  . SHORTNESS OF BREATH (SOB) 09/11/2009  . Pulmonary hypertension ([place]) 09/10/2009  . Osteopenia 09/10/2009    Past Surgical History:  Procedure Laterality Date  . OOPHORECTOMY       OB History   No obstetric history on file.     Family History  Problem Relation Age of Onset  . Diabetes Father   . Heart disease Father   . Hyperlipidemia Brother   . Arthritis Sister   . Colon cancer Mother   . Heart disease Mother   . Hypertension Mother   . Heart disease Maternal Grandfather   . Heart disease Paternal Grandmother   . Heart disease Paternal Grandfather   . Hyperlipidemia Brother   . Hypertension Brother     Social History   Tobacco Use  . Smoking status: Never Smoker  . Smokeless tobacco: Never Used  Substance Use Topics  . Alcohol use: Yes    Comment: 4-6 drinks a week  . Drug use: No    Home Medications Prior to Admission medications   Medication Sig Start Date End Date Taking? Authorizing Provider  amLODipine (NORVASC) 5 MG tablet TAKE 1 TABLET(5 MG) BY MOUTH DAILY 07/11/19   Lorretta Harp, MD  furosemide (LASIX) 40 MG tablet TAKE 1/2 TABLET(20 MG) BY MOUTH DAILY 07/11/19   Lorretta Harp, MD  mirtazapine (REMERON) 15 MG tablet Take 1 tablet (15 mg total) by mouth at bedtime.  09/08/19   Reed, Tiffany L, DO  Multiple Vitamin (MULTIVITAMIN WITH MINERALS) TABS tablet Take 1 tablet by mouth daily.    [provider]  OXYGEN-HELIUM IN Inhale 2 L into the lungs continuous.    [provider]    Allergies    Methocarbamol, Penicillins, Sulfonamide derivatives, and Amoxicillin  Review of Systems   Review of Systems  Unable to perform ROS: Patient unresponsive  level 5 caveat - pt unresponsive.     Physical Exam Updated Vital Signs BP (!) 89/52   Pulse 69   Resp (!) 25   SpO2 100%    Physical Exam Vitals and nursing note reviewed.  Constitutional:      General: She is in acute distress.     Appearance: She is well-developed.     Comments: Pt thin/frail appearing, unresponsive, being bag ventilated.   HENT:     Head:     Comments: Blood from nares. No nasal septal hematoma.     Nose:     Comments: Blood in nares.     Mouth/Throat:     Comments: [place] airway Eyes:     General: No scleral icterus.    Conjunctiva/sclera: Conjunctivae normal.     Comments: Pupils 4 mm, not reactive.   Neck:     Trachea: No tracheal deviation.     Comments: Trachea midline Cardiovascular:     Rate and Rhythm: Normal rate and regular rhythm.     Pulses: Normal pulses.     Heart sounds: Normal heart sounds. No murmur. No friction rub. No gallop.   Pulmonary:     Comments: Apneic. bil bs w bag ventilation.  Abdominal:     General: Bowel sounds are normal. There is no distension.     Palpations: Abdomen is soft.     Tenderness: There is no abdominal tenderness. There is no guarding.  Genitourinary:    Comments: No cva tenderness.  Musculoskeletal:        General: No swelling or tenderness.     Cervical back: Normal range of motion and neck supple. No rigidity. No muscular tenderness.  Skin:    General: Skin is warm and dry.     Findings: No rash.  Neurological:     Comments: GCS 3T. No response to stimuli.   Psychiatric:     Comments: Unresponsive.       ED Results / Procedures / Treatments   Labs (all labs ordered are listed, but only abnormal results are displayed) Results for orders placed or performed during the hospital encounter of 10/03/2019  CBC  Result Value Ref Range   WBC 9.8 4.0 - 10.5 K/uL   RBC 4.25 3.87 - 5.11 MIL/uL   Hemoglobin 13.6 12.0 - 15.0 g/dL   HCT 43.2 36.0 - 46.0 %   MCV 101.6 (H) 80.0 - 100.0 fL   MCH 32.0 26.0 - 34.0 pg   MCHC 31.5 30.0 - 36.0 g/dL   RDW 12.2 11.5 - 15.5 %   Platelets 213 150 - 400 K/uL   nRBC 0.0 0.0 - 0.2 %   Comprehensive metabolic panel  Result Value Ref Range   Sodium 128 (L) 135 - 145 mmol/L   Potassium 4.9 3.5 - 5.1 mmol/L   Chloride 91 (L) 98 - 111 mmol/L   CO2 16 (L) 22 - 32 mmol/L   Glucose, Bld 164 (H) 70 - 99 mg/dL   BUN 19 8 - 23 mg/dL   Creatinine, Ser 1.12 (H) 0.44 -  1.00 mg/dL   Calcium 7.8 (L) 8.9 - 10.3 mg/dL   Total Protein 5.2 (L) 6.5 - 8.1 g/dL   Albumin 3.1 (L) 3.5 - 5.0 g/dL   AST 610 (H) 15 - 41 U/L   ALT 354 (H) 0 - 44 U/L   Alkaline Phosphatase 83 38 - 126 U/L   Total Bilirubin 1.3 (H) 0.3 - 1.2 mg/dL   GFR calc non Af Amer 46 (L) >60 mL/min   GFR calc Af Amer 53 (L) >60 mL/min   Anion gap 21 (H) 5 - 15  Ethanol  Result Value Ref Range   Alcohol, Ethyl (B) <10 <10 mg/dL  Rapid urine drug screen (hospital performed)  Result Value Ref Range   Opiates NONE DETECTED NONE DETECTED   Cocaine NONE DETECTED NONE DETECTED   Benzodiazepines NONE DETECTED NONE DETECTED   Amphetamines NONE DETECTED NONE DETECTED   Tetrahydrocannabinol NONE DETECTED NONE DETECTED   Barbiturates NONE DETECTED NONE DETECTED  Protime-INR  Result Value Ref Range   Prothrombin Time 17.3 (H) 11.4 - 15.2 seconds   INR 1.4 (H) 0.8 - 1.2  Urinalysis, Routine w reflex microscopic  Result Value Ref Range   Color, Urine YELLOW YELLOW   APPearance CLOUDY (A) CLEAR   Specific Gravity, Urine 1.012 1.005 - 1.030   pH 6.0 5.0 - 8.0   Glucose, UA NEGATIVE NEGATIVE mg/dL   Hgb urine dipstick MODERATE (A) NEGATIVE   Bilirubin Urine NEGATIVE NEGATIVE   Ketones, ur 5 (A) NEGATIVE mg/dL   Protein, ur >=300 (A) NEGATIVE mg/dL   Nitrite NEGATIVE NEGATIVE   Leukocytes,Ua NEGATIVE NEGATIVE   RBC / HPF 21-50 0 - 5 RBC/hpf   WBC, UA 0-5 0 - 5 WBC/hpf   Bacteria, UA RARE (A) NONE SEEN   Squamous Epithelial / LPF 0-5 0 - 5   Mucus PRESENT    Hyaline Casts, UA PRESENT    Non Squamous Epithelial 0-5 (A) NONE SEEN  I-stat chem 8, ED (not at [facility] or [facility])  Result Value Ref Range   Sodium 126 (L) 135 - 145  mmol/L   Potassium 4.8 3.5 - 5.1 mmol/L   Chloride 94 (L) 98 - 111 mmol/L   BUN 21 8 - 23 mg/dL   Creatinine, Ser 0.90 0.44 - 1.00 mg/dL   Glucose, Bld 154 (H) 70 - 99 mg/dL   Calcium, Ion 0.93 (L) 1.15 - 1.40 mmol/L   TCO2 19 (L) 22 - 32 mmol/L   Hemoglobin 14.3 12.0 - 15.0 g/dL   HCT 42.0 36.0 - 46.0 %  Troponin I (High Sensitivity)  Result Value Ref Range   Troponin I (High Sensitivity) 9 <18 ng/L   DG Chest Port 1 View  Result Date: 10/07/2019 CLINICAL DATA:  Status post intubation. EXAM: PORTABLE CHEST 1 VIEW COMPARISON:  January 18, 2015 FINDINGS: An endotracheal tube is seen with its distal tip approximately 1.8 cm from the carina. A nasogastric tube is noted with its distal end extending below the level of the diaphragm. There is no evidence of acute infiltrate, pleural effusion or pneumothorax. The cardiac silhouette is markedly enlarged. The visualized skeletal structures are unremarkable. IMPRESSION: 1. Endotracheal tube and nasogastric tube positioning, as described above. 2. Stable cardiomegaly. Electronically Signed   By: Virgina Norfolk M.D.   On: 09/13/2019 20:13    ED ECG REPORT   Date: 09/13/2019  Rate: 71  Rhythm: normal sinus rhythm  QRS Axis: normal  Intervals: normal  ST/T Wave abnormalities: nonspecific T wave  changes  Conduction Disutrbances:LVH  Narrative Interpretation:   Old EKG Reviewed: changes noted  I have personally reviewed the EKG tracing   Radiology DG Chest Port 1 View  Result Date: 10/01/2019 CLINICAL DATA:  Status post intubation. EXAM: PORTABLE CHEST 1 VIEW COMPARISON:  January 18, 2015 FINDINGS: An endotracheal tube is seen with its distal tip approximately 1.8 cm from the carina. A nasogastric tube is noted with its distal end extending below the level of the diaphragm. There is no evidence of acute infiltrate, pleural effusion or pneumothorax. The cardiac silhouette is markedly enlarged. The visualized skeletal structures are unremarkable.  IMPRESSION: 1. Endotracheal tube and nasogastric tube positioning, as described above. 2. Stable cardiomegaly. Electronically Signed   By: Virgina Norfolk M.D.   On: 09/26/2019 20:13    Procedures Procedure Name: Intubation Date/Time: 09/17/2019 7:51 PM Performed by: Lajean Saver, MD Pre-anesthesia Checklist: Patient identified, Patient being monitored, Emergency Drugs available, Timeout performed and Suction available Oxygen Delivery Method: Ambu bag Preoxygenation: Pre-oxygenation with 100% oxygen Induction Type: Rapid sequence Ventilation: Mask ventilation without difficulty Laryngoscope Size: Glidescope Tube size: 7.5 mm Number of attempts: 1 Airway Equipment and Method: Video-laryngoscopy Placement Confirmation: ETT inserted through vocal cords under direct vision,  CO2 detector and Breath sounds checked- equal and bilateral Secured at: 21 cm Tube secured with: ETT holder      (including critical care time)  Medications Ordered in ED Medications  etomidate (AMIDATE) injection (10 mg Intravenous Given 09/16/2019 1937)  succinylcholine (ANECTINE) injection (75 mg Intravenous Given 09/29/2019 1937)  fentaNYL 2561mcg in NS 219mL (20mcg/ml) infusion-PREMIX (has no administration in time range)  fentaNYL (SUBLIMAZE) bolus via infusion 25 mcg (has no administration in time range)    ED Course  I have reviewed the triage vital signs and the nursing notes.  Pertinent labs & imaging results that were available during my care of the patient were reviewed by me and considered in my medical decision making (see chart for details).    MDM Rules/Calculators/A&P                     Patient presents s/p prolonged CPR with [facility] airway.   Patient with strong fem pulses, and palp radial pulses. Ecg. Stat labs. bp is soft, ns bolus.   Tube changed to definitive ETT - see intubation note.  Tube visualized through cords, bil bs, and co2 color change.    Reviewed nursing notes and prior  charts for additional history.  Recent visit to pcp re depression/mood disorder - felt not feel improved then. Prior cardiology eval w pulmonary htn.   Code cooling initiated.   Pt arrived on epi gtt, held, bp > 100.   MDM Number of Diagnoses or Management Options   Amount and/or Complexity of Data Reviewed Clinical lab tests: ordered and reviewed Tests in the radiology section of CPT: ordered and reviewed Tests in the medicine section of CPT: ordered and reviewed Discussion of test results with the performing providers: yes Decide to obtain previous medical records or to obtain history from someone other than the patient: yes Obtain history from someone other than the patient: yes Review and summarize past medical records: yes Discuss the patient with other providers: yes Independent visualization of images, tracings, or specimens: yes  Risk of Complications, Morbidity, and/or Mortality Presenting problems: high Diagnostic procedures: high Management options: high   CXR reviewed/interpreted by me - ett good position.   Initial labs reviewed/interpreted by me -  Wbc normal, hfb  13.6.   ICU team consulted - discussed pt, labs, imaging - will admit.    Additional labs reviewed/interpreted by me - na mod low 126. Ns bolus.   Resp therapy consulted, on initial cxr ett 1.6 cm above carina, will reposition/pull back 2 cm.   Recheck, bil bs. No change in neuro exam.   Ct head pending.   CT reviewed/interpreted by me - c/w diffuse anoxic injury c/w pts history/prolonged cpr, unknown down time prior to cpr.   Medication given/titrated for sedation.   Levophed gtt/fluids to keep pressure > 90.   CRITICAL CARE RE: cardiopulmonary arrest/acute resp failure, prolonged down time/prolonged cpr w rosc, hypotension, hyponatremia, severe diffuse anoxic brain injury Performed by: Mirna Mires Total critical care time: 140 minutes Critical care time was exclusive of separately  billable procedures and treating other patients. Critical care was necessary to treat or prevent imminent or life-threatening deterioration. Critical care was time spent personally by me on the following activities: development of treatment plan with patient and/or surrogate as well as nursing, discussions with consultants, evaluation of patient's response to treatment, examination of patient, obtaining history from patient or surrogate, ordering and performing treatments and interventions, ordering and review of laboratory studies, ordering and review of radiographic studies, pulse oximetry and re-evaluation of patient's condition.     Final Clinical Impression(s) / ED Diagnoses Final diagnoses:  None    Rx / DC Orders ED Discharge Orders    None       Lajean Saver, MD 07-Oct-2019 1432

## 2019-09-18 NOTE — Progress Notes (Signed)
Patient transported to CT and back with no complications. Orange bite block placed on ETT once back from CT due to patient biting down on tube.

## 2019-09-18 NOTE — ED Triage Notes (Addendum)
PT arrived via EMS from home following cardiac arrest with CPR. Per EMS family left pt for approx 45 min and upon arrival back home found pt on stairs apneic and pulseless. Upon EMS arrival pt was in asystole rhythm. CPR began for 30 minutes durning which 6 rounds of EPI were given and pt converted to PEA. ROSC was achieved after thirty minutes. Per EMS after two failed intubation attempts a king airway was placed. Upon arrival to ED pt was ventilated via bag through king airway. trauma with blood present in airway and nose from previous intubation attempts.

## 2019-09-18 NOTE — Plan of Care (Signed)
Patient seen and examined at bedside. Ms. Wieringa daughter Judson Roch was at bedside, who came in to the hospital after finding her mother at home and calling EMS. She had discussed terminating resuscitation efforts with EMS just before they achieved ROSC. She is her mother's power of attorney, but her mother had never indicated what she would want in this situation. She understands that she would be unlikely to tolerate another prolonged time without oxygen if her heart were to stop beating again. She agrees with DNR code status but wishes to continue her current care at this time.   Code status changed in the chart. Full admission note to follow.   Julian Hy, DO 09/21/2019 10:36 PM [facility]

## 2019-09-19 ENCOUNTER — Inpatient Hospital Stay (HOSPITAL_COMMUNITY): Payer: Medicare Other

## 2019-09-19 LAB — POCT I-STAT 7, (LYTES, BLD GAS, ICA,H+H)
Acid-base deficit: 3 mmol/L — ABNORMAL HIGH (ref 0.0–2.0)
Bicarbonate: 23.8 mmol/L (ref 20.0–28.0)
Calcium, Ion: 1.14 mmol/L — ABNORMAL LOW (ref 1.15–1.40)
HCT: 42 % (ref 36.0–46.0)
Hemoglobin: 14.3 g/dL (ref 12.0–15.0)
O2 Saturation: 94 %
Patient temperature: 37.4
Potassium: 4.1 mmol/L (ref 3.5–5.1)
Sodium: 130 mmol/L — ABNORMAL LOW (ref 135–145)
TCO2: 25 mmol/L (ref 22–32)
pCO2 arterial: 49.8 mmHg — ABNORMAL HIGH (ref 32.0–48.0)
pH, Arterial: 7.29 — ABNORMAL LOW (ref 7.350–7.450)
pO2, Arterial: 80 mmHg — ABNORMAL LOW (ref 83.0–108.0)

## 2019-09-19 LAB — LACTIC ACID, PLASMA
Lactic Acid, Venous: 4.9 mmol/L (ref 0.5–1.9)
Lactic Acid, Venous: 6.2 mmol/L (ref 0.5–1.9)

## 2019-09-19 LAB — BASIC METABOLIC PANEL
Anion gap: 17 — ABNORMAL HIGH (ref 5–15)
BUN: 26 mg/dL — ABNORMAL HIGH (ref 8–23)
CO2: 20 mmol/L — ABNORMAL LOW (ref 22–32)
Calcium: 8.4 mg/dL — ABNORMAL LOW (ref 8.9–10.3)
Chloride: 95 mmol/L — ABNORMAL LOW (ref 98–111)
Creatinine, Ser: 1.16 mg/dL — ABNORMAL HIGH (ref 0.44–1.00)
GFR calc Af Amer: 51 mL/min — ABNORMAL LOW (ref >60)
GFR calc non Af Amer: 44 mL/min — ABNORMAL LOW (ref >60)
Glucose, Bld: 160 mg/dL — ABNORMAL HIGH (ref 70–99)
Potassium: 4.7 mmol/L (ref 3.5–5.1)
Sodium: 132 mmol/L — ABNORMAL LOW (ref 135–145)

## 2019-09-19 LAB — MAGNESIUM: Magnesium: 1.9 mg/dL (ref 1.7–2.4)

## 2019-09-19 LAB — CBC
HCT: 43.5 % (ref 36.0–46.0)
Hemoglobin: 14.1 g/dL (ref 12.0–15.0)
MCH: 31.8 pg (ref 26.0–34.0)
MCHC: 32.4 g/dL (ref 30.0–36.0)
MCV: 98.2 fL (ref 80.0–100.0)
Platelets: 205 10*3/uL (ref 150–400)
RBC: 4.43 MIL/uL (ref 3.87–5.11)
RDW: 12.1 % (ref 11.5–15.5)
WBC: 18.1 10*3/uL — ABNORMAL HIGH (ref 4.0–10.5)
nRBC: 0 % (ref 0.0–0.2)

## 2019-09-19 LAB — PHOSPHORUS: Phosphorus: 5.7 mg/dL — ABNORMAL HIGH (ref 2.5–4.6)

## 2019-09-19 LAB — GLUCOSE, CAPILLARY
Glucose-Capillary: 137 mg/dL — ABNORMAL HIGH (ref 70–99)
Glucose-Capillary: 101 mg/dL — ABNORMAL HIGH (ref 70–99)

## 2019-09-19 LAB — TROPONIN I (HIGH SENSITIVITY): Troponin I (High Sensitivity): 36 ng/L — ABNORMAL HIGH (ref <18)

## 2019-09-19 LAB — TRIGLYCERIDES: Triglycerides: 46 mg/dL (ref <150)

## 2019-09-19 MED ORDER — ACETAMINOPHEN 325 MG PO TABS: 650.0000 mg | ORAL_TABLET | Freq: Four times a day (QID) | ORAL | Status: DC | PRN

## 2019-09-19 MED ORDER — DIPHENHYDRAMINE HCL 50 MG/ML IJ SOLN: 25.0000 mg | INTRAMUSCULAR | Status: DC | PRN

## 2019-09-19 MED ORDER — CHLORHEXIDINE GLUCONATE CLOTH 2 % EX PADS
6.0000 | MEDICATED_PAD | Freq: Every day | CUTANEOUS | Status: DC
  Administered 2019-09-19: 6 via TOPICAL

## 2019-09-19 MED ORDER — INSULIN ASPART 100 UNIT/ML ~~LOC~~ SOLN
1.0000 [IU] | SUBCUTANEOUS | Status: DC
  Administered 2019-09-19: 1 [IU] via SUBCUTANEOUS

## 2019-09-19 MED ORDER — DEXTROSE 5 % IV SOLN: INTRAVENOUS | Status: DC

## 2019-09-19 MED ORDER — POLYVINYL ALCOHOL 1.4 % OP SOLN
1.0000 [drp] | Freq: Four times a day (QID) | OPHTHALMIC | Status: DC | PRN
  Filled 2019-09-19: qty 15

## 2019-09-19 MED ORDER — LEVETIRACETAM IN NACL 1500 MG/100ML IV SOLN
1500.0000 mg | Freq: Once | INTRAVENOUS | Status: AC
  Administered 2019-09-19: 1500 mg via INTRAVENOUS
  Filled 2019-09-19: qty 100

## 2019-09-19 MED ORDER — ORAL CARE MOUTH RINSE
15.0000 mL | OROMUCOSAL | Status: DC
  Administered 2019-09-19 (×3): 15 mL via OROMUCOSAL

## 2019-09-19 MED ORDER — GLYCOPYRROLATE 0.2 MG/ML IJ SOLN
0.2000 mg | INTRAMUSCULAR | Status: DC | PRN
  Filled 2019-09-19: qty 1

## 2019-09-19 MED ORDER — LEVETIRACETAM IN NACL 500 MG/100ML IV SOLN: 500.0000 mg | Freq: Two times a day (BID) | INTRAVENOUS | Status: DC

## 2019-09-19 MED ORDER — ACETAMINOPHEN 650 MG RE SUPP: 650.0000 mg | Freq: Four times a day (QID) | RECTAL | Status: DC | PRN

## 2019-09-19 MED ORDER — DOCUSATE SODIUM 50 MG/5ML PO LIQD: 100.0000 mg | Freq: Two times a day (BID) | ORAL | Status: DC

## 2019-09-19 MED ORDER — GLYCOPYRROLATE 1 MG PO TABS
1.0000 mg | ORAL_TABLET | ORAL | Status: DC | PRN
  Filled 2019-09-19: qty 1

## 2019-09-19 MED ORDER — CHLORHEXIDINE GLUCONATE 0.12% ORAL RINSE (MEDLINE KIT)
15.0000 mL | Freq: Two times a day (BID) | OROMUCOSAL | Status: DC
  Administered 2019-09-19 (×2): 15 mL via OROMUCOSAL

## 2019-09-19 MED ORDER — GLYCOPYRROLATE 0.2 MG/ML IJ SOLN: 0.2000 mg | INTRAMUSCULAR | Status: DC | PRN

## 2019-09-19 MED ORDER — MIDAZOLAM HCL 2 MG/2ML IJ SOLN: 2.0000 mg | INTRAMUSCULAR | Status: DC | PRN

## 2019-09-20 LAB — HEMOGLOBIN A1C
Hgb A1c MFr Bld: 5.6 % (ref 4.8–5.6)
Mean Plasma Glucose: 114 mg/dL

## 2019-09-22 ENCOUNTER — Ambulatory Visit: Payer: Medicare Other | Admitting: Internal Medicine

## 2019-10-08 NOTE — Progress Notes (Signed)
Chaplain provided support to daughter at her mom's bedside.  Daughter shared that she is so thankful for the care that the nurses from the night shift until now have offered to her.  Chaplain affirmed the ways in which the nurses take care of patients and families on this unit.  Daughter also shared that she lost her dad only five months ago.  Chaplain affirmed daughter's grief and need to take care of herself in the midst of losing her parents. Daughter stated that she had wonderful parents who took good care of them.  She uplifted her mom who had a hard childhood but was able to raise her children much different than what she experienced.  Chaplain affirmed daughter's honor of her parents and the ways in which she plans to keep their memories alive through her children.  Chaplain offered the ministry of presence and listening.

## 2019-10-08 NOTE — Death Summary Note (Signed)
  Name: Julia Lucero MRN: [id number] DOB: 1936/02/19 84 y.o.  Date of Admission: 09/17/2019  7:31 PM Date of Discharge: Oct 12, 2019 Attending Physician: Ina Homes, MD  Discharge Diagnosis: Active Problems:   Cardiac arrest [facility]) Anoxic brain injury  Cause of death: Anoxic brain injury secondary to cardiac arrest  Time of death: 09/22/1108  Disposition and follow-up:   Ms.Julia Lucero was discharged from [facility] in expired condition.    Hospital Course: 84 year old female with a pertinent past medical history of pulmonary hypertension, COPD, was found at home unresponsive by daughter.  EMS called, and found the patient pulseless on arrival underwent 30 minutes of CPR.  King airway inserted on arrival with ventilation.  Asystole was eventually converted to PEA with 6 rounds of epi prior to ROSC.  On presentation at the emergency department patient was unresponsive with myoclonus and epistaxis, she was initially hypothermic and hypotensive which improved with peripheral Levophed.  EKG without signs of acute ischemia and negative troponin.  CT scan of the head showed diffuse cerebral edema compatible with anoxic injury, early intracranial mass-affect without hemorrhage.  The patient's daughter was counseled regarding her prognosis and the patient was transitioned to comfort care.  Patient was pronounced dead at 1110 on 10/12/2019.  Signed: Marianna Payment, MD Oct 12, 2019, 2:24 PM

## 2019-10-08 NOTE — Procedures (Signed)
Extubation Procedure Note  Patient Details:   Name: Julia Lucero DOB: [birth year] MRN: [id number]   Airway Documentation:    Vent end date: Oct 10, 2019 Vent end time: 1050   Evaluation Patient extubated per MD order with comfort measures in place. RN and family at bedside.    [name] Oct 10, 2019, 10:53 AM

## 2019-10-08 NOTE — Progress Notes (Signed)
175 mL of fentanyl wasted with Kathleene Hazel RN as witness.

## 2019-10-08 NOTE — Progress Notes (Signed)
CDS called with referral number [phone]

## 2019-10-08 NOTE — Progress Notes (Signed)
[facility] Progress Note Patient Name: Julia Lucero DOB: 1935-07-01 MRN: [id number]   Date of Service  09-30-19  HPI/Events of Note  59 F found unresponsive by daughter last seen well 45 minutes prior. On EMS arrival she was in asystole with conversion to PEA during CPR and achieved ROSC after approximately 30 minutes. No significant electrolyte abnormality. Head CT with evidence of diffuse edema and early intracranial mass effect. Also with myoclonic jerks given Keppra.  eICU Interventions  Family made aware of poor prognosis for neurologic recovery and patient has been made DNR. Continue to offer best supportive care for now.     Intervention Category Major Interventions: Hypotension - evaluation and management;Seizures - evaluation and management Evaluation Type: New Patient Evaluation  Judd Lien 30-Sep-2019, 1:30 AM

## 2019-10-08 NOTE — Progress Notes (Signed)
RT obtained ABG on pt with the following results and changes. Rate increased from 20 to 24 for a PaCO2 of 49.8 per MD order. RT will continue to monitor.   Results for Julia Lucero, Julia Lucero (MRN [id number]) as of 2019/09/22 06:46  Ref. Range 2019/09/22 06:38  Sample type Unknown ARTERIAL  pH, Arterial Latest Ref Range: 7.350 - 7.450  7.290 (L)  pCO2 arterial Latest Ref Range: 32.0 - 48.0 mmHg 49.8 (H)  pO2, Arterial Latest Ref Range: 83.0 - 108.0 mmHg 80.0 (L)  TCO2 Latest Ref Range: 22 - 32 mmol/L 25  Acid-base deficit Latest Ref Range: 0.0 - 2.0 mmol/L 3.0 (H)  Bicarbonate Latest Ref Range: 20.0 - 28.0 mmol/L 23.8  O2 Saturation Latest Units: % 94.0  Patient temperature Unknown 37.4 C  Collection site Unknown RADIAL, ALLEN'S TEST ACCEPTABLE

## 2019-10-08 NOTE — Progress Notes (Signed)
Arrives to 2H25 from ED s/p cardiac arrest. Intubated, sats stable.   + cough Opens eyes Breathes over vent No gag, pupils nonreactive  No purposeful movement noted, myoclonic/startle movements to stimulation. Opens eyes, does not tract or turn towards voice.   Hypothermic (temp from foley) 33.9 C on arrival, incr to 34.3 C with warm blankets.   NSR 60s on monitor. BP 105/48 on levo gtt @ 5 mcg/min, fentanyl gtt @ 50 mcg/hr and diprivan gtt @ 5 mcg/kg/min  Bilat rhino rockets noted, continues to trickle blood from nose/mouth, cleansed. OGT to suction with bloody output.   eLink to notify CDS.

## 2019-10-08 NOTE — Progress Notes (Signed)
[facility] Progress Note Patient Name: Julia Lucero DOB: [birth year] MRN: [id number]   Date of Service  2019/09/21  HPI/Events of Note  Notified of loose stools , request for Flexiseal  eICU Interventions  Flexiseal ordered     Intervention Category Major Interventions: Other:  Judd Lien Sep 21, 2019, 5:35 AM

## 2019-10-08 NOTE — Progress Notes (Signed)
PT's daughter, Judson Roch, here and into room. Updated given. Aware of very poor prognosis, DNR status. Emotional support provided.

## 2019-10-08 NOTE — Progress Notes (Signed)
Per Tamala Julian MD, no escalation of care at this time.

## 2019-10-08 NOTE — Progress Notes (Signed)
Patient expired at 1110. Daughter Judson Roch at bedside. Medications discontinued. Meredith Leeds RN and Nelva Bush RN pronounced time of death. Attending physician Erskine Emery MD and CDS notified.

## 2019-10-08 NOTE — Progress Notes (Signed)
PULMONARY / CRITICAL CARE MEDICINE   NAME:  Julia Lucero, MRN:  [id number], DOB:  Jun 10, 1935, LOS: 1 ADMISSION DATE:  10/02/2019, CONSULTATION DATE:  09/09/2019 REFERRING MD:  EDP, CHIEF COMPLAINT:  Cardiac arrest  BRIEF HISTORY:    84 y.o. F with PMH of pulmonary HTN, COPD on 2L home O2 who was found unresponsive in a chair by her daughter.  Pulseless on EMS arrival and pt underwent 30 minutes of CPR before ROSC achieved.  Intubated and head CT negative. PCCM consulted for admission.  HISTORY OF PRESENT ILLNESS   84 y.o. F with PMH of pulmonary HTN, COPD on 2L home O2 who was found unresponsive in a chair by her daughter.  Her daughter had just seen her approximately 45 minutes before.  She was in her usual state of health except for some allergies from pollen, afebrile and vaccinated against COVID-19.  When EMS arrived patient was asystolic then converted to PEA, approximately 30 minutes of CPR with 6 rounds of epi for ROSC was achieved.  Apparently multiple intubation attempts.  The emergency department, patient has been unresponsive with myoclonus and epistaxis.  She was initially hypothermic and hypotensive which improved with peripheral Levophed.  EKG without signs of acute ischemia and negative troponin.  Head CT with no acute bleed, positive for signs of anoxic injury.  Work-up without signs of acute infection, ABG pending, revealed elevated creatinine and transaminases.   Patient's daughters at the bedside, she states that she is not had specific end-of-life discussions with her mother.  Prognosis discussed and she is comfortable with DNR status  SIGNIFICANT PAST MEDICAL HISTORY   Allergic rhinitis Chronic fatigue COPD Depression Hypertension Pulmonary hypertension/restrictive lung disease  SIGNIFICANT EVENTS:  4/11 admit to PCCM  STUDIES:   09/16/2019 CXR>> marked cardiomegaly, no acute findings  4/11 CT head>> diffuse cerebral edema compatible with anoxic injury, early  intracranial mass-effect no hemorrhage  4/12 Echo >>pending CULTURES:  4/11 COVID-19/viral panel -negative  ANTIBIOTICS:  4/11 Ancef>  LINES/TUBES:  4/11 ET tube>  CONSULTANTS:  PCCM primary  SUBJECTIVE:  Patient has not had any fevers overnight with maps greater than 65 and SPO2 in the mid to high 90s on ventilator support.  CONSTITUTIONAL: BP (!) 96/52   Pulse 83   Temp 99.7 F (37.6 C)   Resp (!) 24   Ht 5' (1.524 m)   Wt 47 kg   SpO2 99%   BMI 20.24 kg/m   I/O last 3 completed shifts: In: 1000.2 [I.V.:850.2; IV Piggyback:150] Out: 175 [Urine:25; Emesis/NG output:150]     Vent Mode: PRVC FiO2 (%):  [100 %] 100 % Set Rate:  [16 bmp-24 bmp] 24 bmp Vt Set:  [360 mL] 360 mL PEEP:  [5 cmH20] 5 cmH20 Plateau Pressure:  [21 cmH20-24 cmH20] 24 cmH20 Driving pressure: 19   PHYSICAL EXAM: General: Elderly, chronically ill-appearing female unresponsive HEENT: MM pink/moist, significant bilateral epistaxis ET tube in place Neuro: No purposeful movement, persistent generalized myoclonus Still able to initiate some breaths Corneal reflex absent on left CV: s1s2 rrr, no m/r/g PULM: Intubated on full vent support, lungs clear bilaterally GI: soft, bsx4 active  Extremities: warm/dry, no edema  Skin: no rashes or lesions  RESOLVED PROBLEM LIST    ASSESSMENT AND PLAN   Unwitnessed cardiac arrest, asystole and PEA Encephalopathy with myoclonus -Unknown downtime with 30 minutes of CPR and presenting hypothermia therefore patient is not a candidate for TTM -Patient's daughter was counseled on her mother's prognosis.  Considering she has  CT skin evidence of anoxic brain injury combined with her myoclonic jerks and low GCS, it is unlikely the patient will make any meaningful recovery from her cardiac arrest. -After having extensive goals of care conversation with the patient's daughter, she determined that she would like to transition to comfort care.  Epistaxis -Rhino  Rocket in place  Acute kidney injury and elevated transaminases Patient's creatinine has improved today at 1.1. Likely secondary to her shock on presentation.  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  Hyperglycemia: Patient's blood glucose was in goal range this morning.  We will switch to comfort care at this time.   Chronic hyponatremia: Patient's hyponatremia has improved since yesterday.  SUMMARY OF TODAY'S PLAN:  After goals of care conversation, the patient's family will transition her to full comfort care measures.  Best Practice / Goals of Care / Disposition.   DVT PROPHYLAXIS: Heparin Pain/Anxiety/Delirium protocol (if indicated): Propofol, fentanyl SUP: PPI Glucose: SSI NUTRITION: N.p.o. MOBILITY: Bedrest GOALS OF CARE: DNR/comfort care FAMILY DISCUSSIONS: Family updated yesterday via patient's daughter at bedside DISPOSITION ICU  LABS  Glucose Recent Labs  Lab 10-01-2019 0428  GLUCAP 137*    BMET Recent Labs  Lab 09/17/2019 2021 09/17/2019 2021 09/22/2019 2049 09/12/2019 2049 10/04/2019 2220 Oct 01, 2019 0353 2019-10-01 0638  NA 128*   < > 126*   < > 126* 132* 130*  K 4.9   < > 4.8   < > 4.6 4.7 4.1  CL 91*  --  94*  --   --  95*  --   CO2 16*  --   --   --   --  20*  --   BUN 19  --  21  --   --  26*  --   CREATININE 1.12*  --  0.90  --   --  1.16*  --   GLUCOSE 164*  --  154*  --   --  160*  --    < > = values in this interval not displayed.    Liver Enzymes Recent Labs  Lab 09/16/2019 2021  AST 610*  ALT 354*  ALKPHOS 83  BILITOT 1.3*  ALBUMIN 3.1*    Electrolytes Recent Labs  Lab 09/21/2019 2021 Oct 01, 2019 0353  CALCIUM 7.8* 8.4*  MG  --  1.9  PHOS  --  5.7*    CBC Recent Labs  Lab 09/30/2019 2021 09/28/2019 2049 10/04/2019 2220 10-01-2019 0353 10-01-19 0638  WBC  9.8  --   --  18.1*  --   HGB 13.6   < > 16.7* 14.1 14.3  HCT 43.2   < > 49.0* 43.5 42.0  PLT 213  --   --  205  --    < > = values in this interval not displayed.    ABG Recent Labs  Lab 09/27/2019 2220 October 01, 2019 0638  PHART 7.216* 7.290*  PCO2ART 52.6* 49.8*  PO2ART 60.0* 80.0*    Coag's Recent Labs  Lab 09/26/2019 2021  INR 1.4*    Sepsis Markers Recent Labs  Lab 2019-10-01 0002 2019/10/01 0312  LATICACIDVEN 6.2* 4.9*    Cardiac Enzymes No results for input(s): TROPONINI, PROBNP in the last 168 hours.  PAST MEDICAL HISTORY :   She  has a past medical history of Allergy, Chronic fatigue, COPD (chronic obstructive pulmonary disease) ([place]), Depression, recurrent ([place]) (  08/12/2019), Disorder of bone and cartilage, unspecified, Hypertension, Oxygen deficiency, Oxygen dependent, Pulmonary hypertension ([place]), Restrictive lung disease, and Rickettsia infection.  PAST SURGICAL HISTORY:  She  has a past surgical history that includes Oophorectomy.  Allergies  Allergen Reactions  . Methocarbamol     Altered thought process  . Penicillins   . Sulfonamide Derivatives   . Amoxicillin Rash    No current facility-administered medications on file prior to encounter.   Current Outpatient Medications on File Prior to Encounter  Medication Sig  . amLODipine (NORVASC) 5 MG tablet TAKE 1 TABLET(5 MG) BY MOUTH DAILY  . furosemide (LASIX) 40 MG tablet TAKE 1/2 TABLET(20 MG) BY MOUTH DAILY  . mirtazapine (REMERON) 15 MG tablet Take 1 tablet (15 mg total) by mouth at bedtime.  . Multiple Vitamin (MULTIVITAMIN WITH MINERALS) TABS tablet Take 1 tablet by mouth daily.  . OXYGEN-HELIUM IN Inhale 2 L into the lungs continuous.    FAMILY HISTORY:   Her family history includes Arthritis in her sister; Colon cancer in her mother; Diabetes in her father; Heart disease in her father, maternal grandfather, mother, paternal grandfather, and paternal grandmother; Hyperlipidemia in her brother and  brother; Hypertension in her brother and mother.  SOCIAL HISTORY:  She  reports that she has never smoked. She has never used smokeless tobacco. She reports current alcohol use. She reports that she does not use drugs.  REVIEW OF SYSTEMS:    See above.

## 2019-10-08 DEATH — deceased

## 2020-03-17 IMAGING — CT CT HEAD WITHOUT CONTRAST
4 series · 16 of 47 positions shown, 18 images · non-contrast
Comparison: Head CT 07/21/2016 and brain MRI 07/22/2016.

CLINICAL DATA: 83-year-old female with headache, sudden onset
dizziness and nausea.

EXAM:
CT HEAD WITHOUT CONTRAST
TECHNIQUE: Contiguous axial images were obtained from the base of the skull
through the vertex without intravenous contrast.

[Series 3: head wo · axial · 0.46mm/px · z∈[-134,-14]mm · 7 of 32 slices shown, 9 images]
[im 4/32  brain]
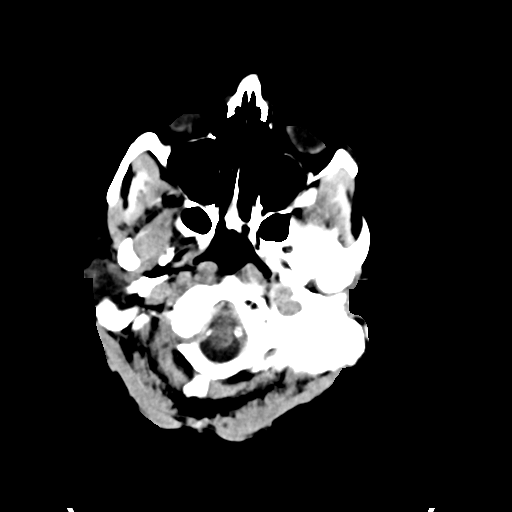
[im 4/32  bone]
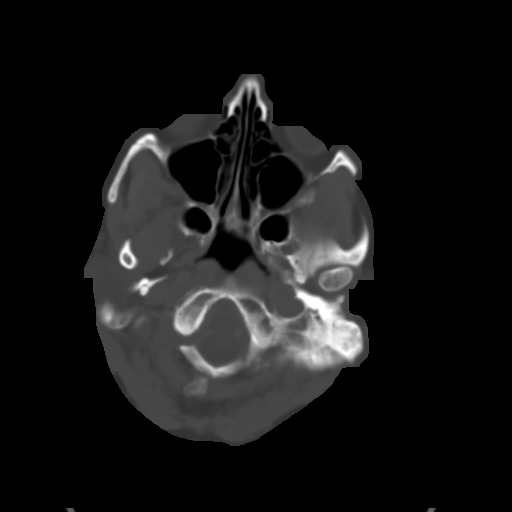
[im 8/32  brain]
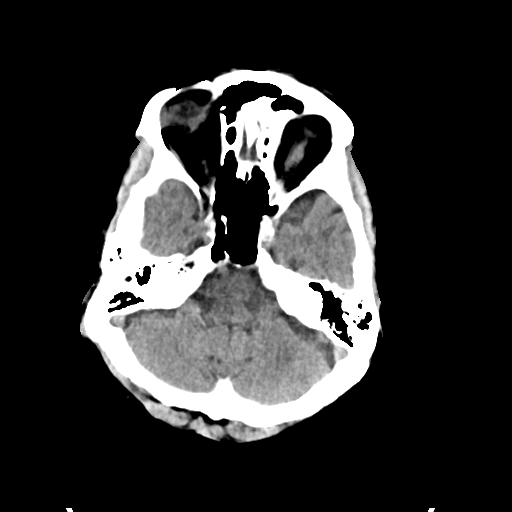
[im 12/32  brain]
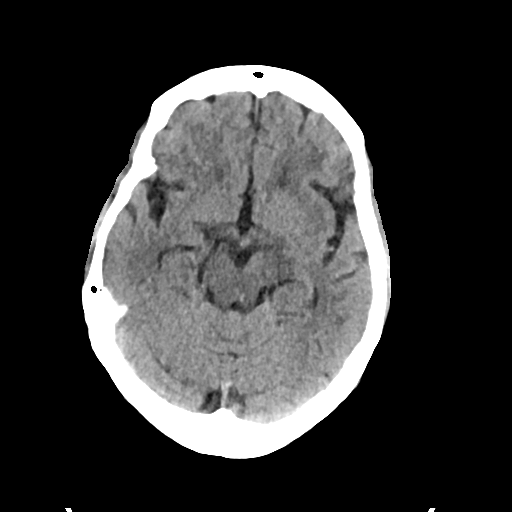
[im 16/32  brain]
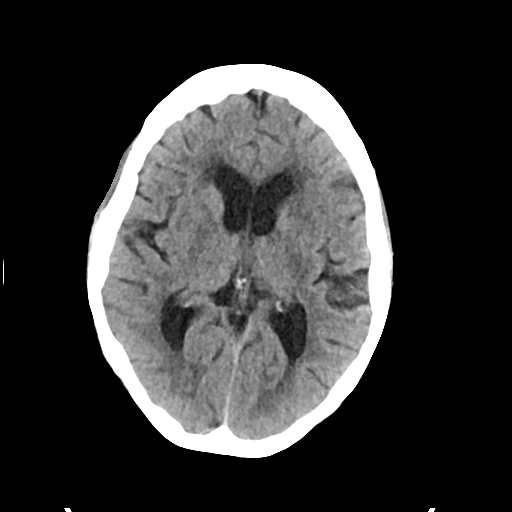
[im 20/32  brain]
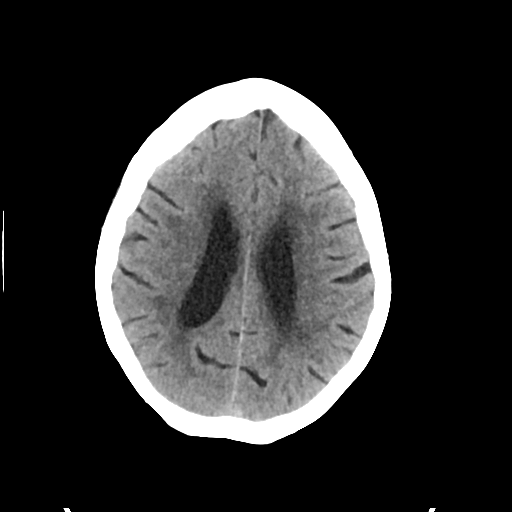
[im 20/32  bone]
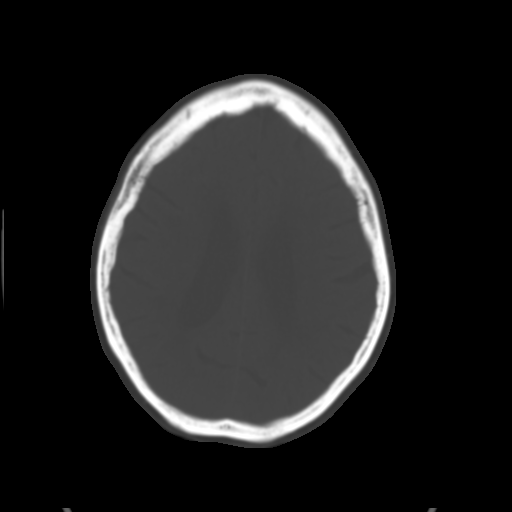
[im 24/32  brain]
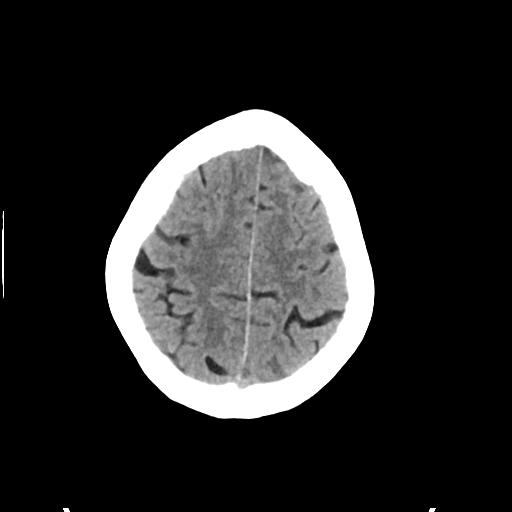
[im 28/32  brain]
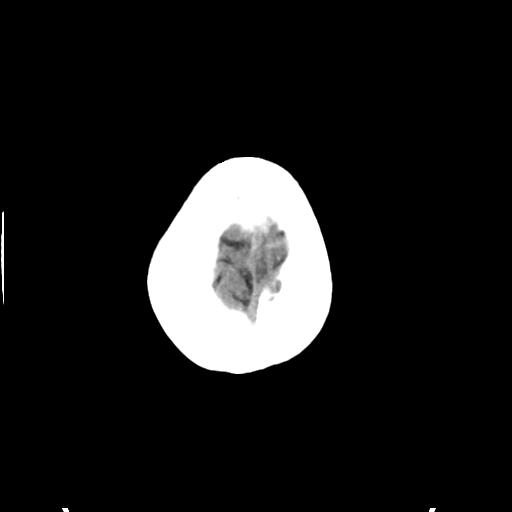

[Series 4: head bone · axial · 0.46mm/px · z∈[-135,-103]mm · 3 of 78 slices shown]
[im 8/78  bone]
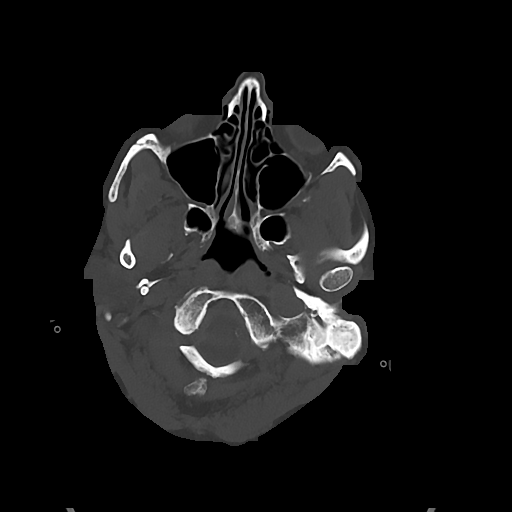
[im 16/78  bone]
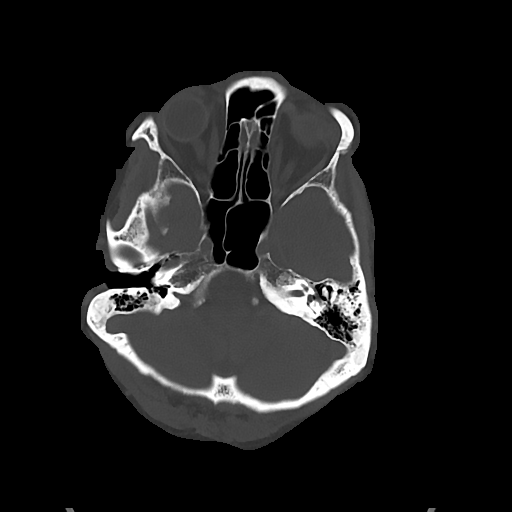
[im 24/78  bone]
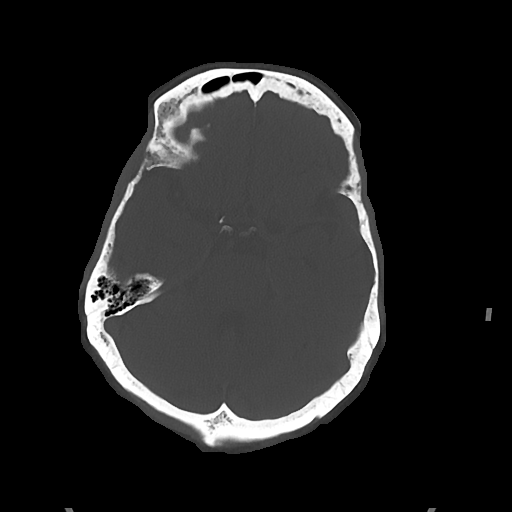

[Series 5: cor soft · coronal · 0.30mm/px · 3 of 72 slices shown]
[im 24/72  brain]
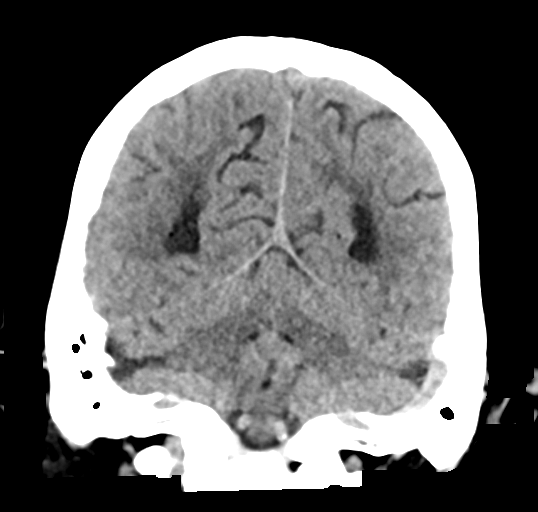
[im 32/72  brain]
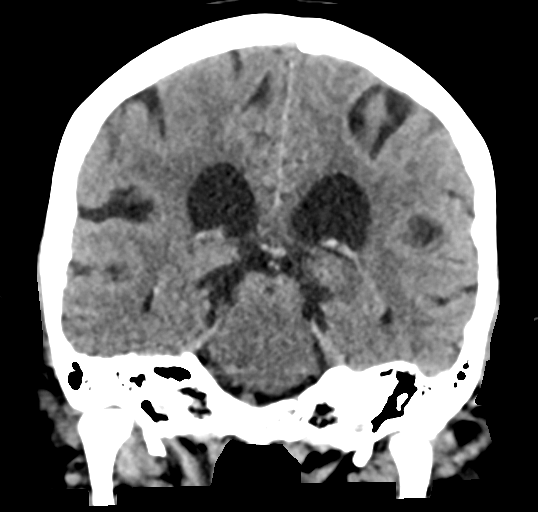
[im 40/72  brain]
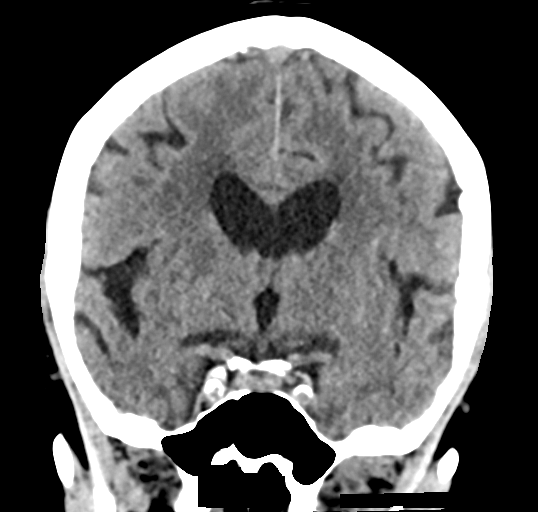

[Series 6: sag soft · sagittal · 0.30mm/px · 3 of 57 slices shown]
[im 19/57  brain]
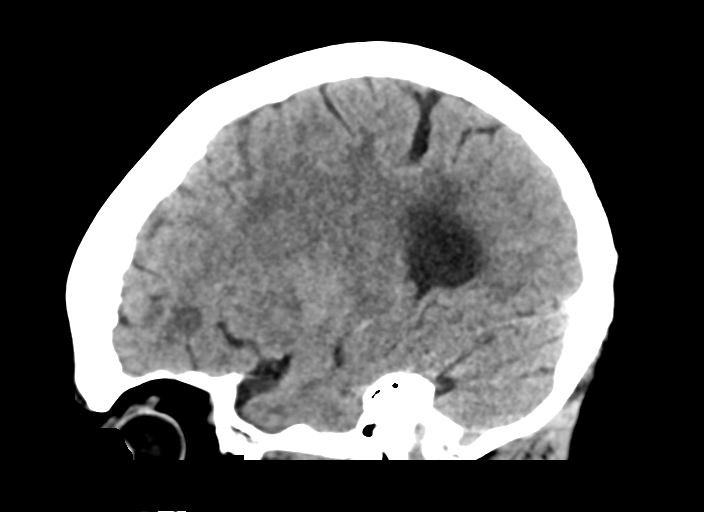
[im 29/57  brain]
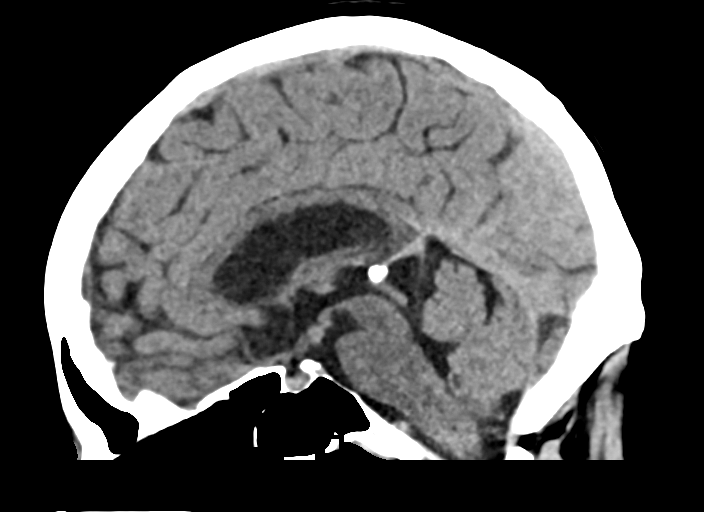
[im 38/57  brain]
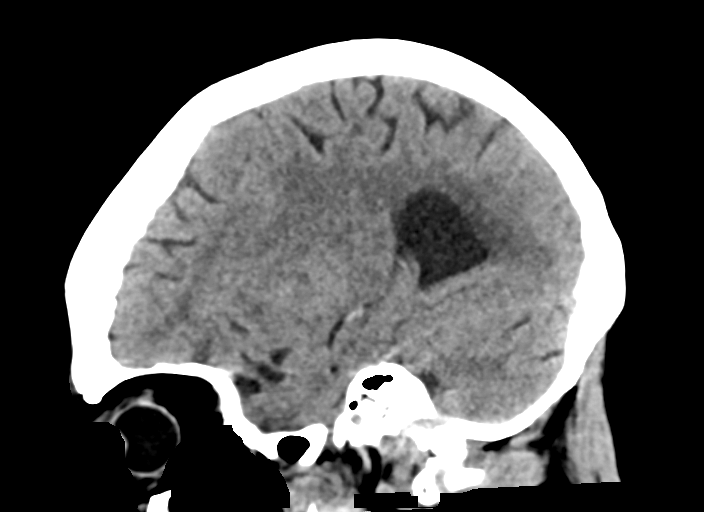

[16 of 47 positions shown; findings below may reference images not displayed]

FINDINGS: Brain: Cerebral volume is not significantly changed since 9043.

Patchy bilateral white matter hypodensity is stable.

No midline shift, ventriculomegaly, mass effect, evidence of mass
lesion, intracranial hemorrhage or evidence of cortically based
acute infarction. No cortical encephalomalacia identified.

Vascular: Calcified atherosclerosis at the skull base. No suspicious
intracranial vascular hyperdensity.

Skull: No acute osseous abnormality identified.

Sinuses/Orbits: Visualized paranasal sinuses and mastoids are stable
and well pneumatized. Tympanic cavities remain clear.

Other: No acute orbit or scalp soft tissue finding.
IMPRESSION: No acute intracranial abnormality and stable non contrast CT
appearance of the brain since [DATE].
